# Patient Record
Sex: Female | Born: 1970 | Race: Black or African American | Hispanic: No | Marital: Single | Smoking: Current every day smoker
Health system: Southern US, Community
[De-identification: ages and names within clinical notes are randomized; demographics above are authoritative.]

## PROBLEM LIST (undated history)

## (undated) DIAGNOSIS — K625 Hemorrhage of anus and rectum: Secondary | ICD-10-CM

## (undated) DIAGNOSIS — K219 Gastro-esophageal reflux disease without esophagitis: Secondary | ICD-10-CM

## (undated) DIAGNOSIS — R12 Heartburn: Secondary | ICD-10-CM

## (undated) DIAGNOSIS — R109 Unspecified abdominal pain: Secondary | ICD-10-CM

## (undated) DIAGNOSIS — D219 Benign neoplasm of connective and other soft tissue, unspecified: Secondary | ICD-10-CM

## (undated) DIAGNOSIS — R21 Rash and other nonspecific skin eruption: Secondary | ICD-10-CM

## (undated) DIAGNOSIS — H539 Unspecified visual disturbance: Secondary | ICD-10-CM

## (undated) DIAGNOSIS — K5900 Constipation, unspecified: Secondary | ICD-10-CM

## (undated) DIAGNOSIS — D126 Benign neoplasm of colon, unspecified: Secondary | ICD-10-CM

## (undated) DIAGNOSIS — M791 Myalgia, unspecified site: Secondary | ICD-10-CM

## (undated) DIAGNOSIS — IMO0002 Reserved for concepts with insufficient information to code with codable children: Secondary | ICD-10-CM

## (undated) HISTORY — DX: Benign neoplasm of colon, unspecified: D12.6

## (undated) HISTORY — DX: Reserved for concepts with insufficient information to code with codable children: IMO0002

## (undated) HISTORY — DX: Unspecified visual disturbance: H53.9

## (undated) HISTORY — DX: Benign neoplasm of connective and other soft tissue, unspecified: D21.9

## (undated) HISTORY — DX: Myalgia, unspecified site: M79.10

## (undated) HISTORY — PX: WISDOM TOOTH EXTRACTION: SHX21

## (undated) HISTORY — DX: Heartburn: R12

## (undated) HISTORY — DX: Constipation, unspecified: K59.00

## (undated) HISTORY — DX: Hemorrhage of anus and rectum: K62.5

## (undated) HISTORY — DX: Rash and other nonspecific skin eruption: R21

## (undated) HISTORY — PX: THERAPEUTIC ABORTION: SHX798

## (undated) HISTORY — DX: Unspecified abdominal pain: R10.9

## (undated) HISTORY — DX: Gastro-esophageal reflux disease without esophagitis: K21.9

---

## 1998-12-18 ENCOUNTER — Emergency Department (HOSPITAL_COMMUNITY): Admission: EM | Admit: 1998-12-18 | Discharge: 1998-12-18 | Payer: Self-pay

## 1998-12-18 ENCOUNTER — Encounter: Payer: Self-pay | Admitting: Emergency Medicine

## 1998-12-29 ENCOUNTER — Emergency Department (HOSPITAL_COMMUNITY): Admission: EM | Admit: 1998-12-29 | Discharge: 1998-12-29 | Payer: Self-pay | Admitting: Emergency Medicine

## 1999-07-23 ENCOUNTER — Emergency Department (HOSPITAL_COMMUNITY): Admission: EM | Admit: 1999-07-23 | Discharge: 1999-07-24 | Payer: Self-pay | Admitting: Emergency Medicine

## 2000-09-02 ENCOUNTER — Emergency Department (HOSPITAL_COMMUNITY): Admission: EM | Admit: 2000-09-02 | Discharge: 2000-09-03 | Payer: Self-pay | Admitting: Emergency Medicine

## 2000-09-03 ENCOUNTER — Encounter: Payer: Self-pay | Admitting: Emergency Medicine

## 2001-09-03 ENCOUNTER — Encounter: Payer: Self-pay | Admitting: Emergency Medicine

## 2001-09-03 ENCOUNTER — Emergency Department (HOSPITAL_COMMUNITY): Admission: EM | Admit: 2001-09-03 | Discharge: 2001-09-03 | Payer: Self-pay | Admitting: Emergency Medicine

## 2002-07-13 ENCOUNTER — Emergency Department (HOSPITAL_COMMUNITY): Admission: EM | Admit: 2002-07-13 | Discharge: 2002-07-13 | Payer: Self-pay | Admitting: *Deleted

## 2002-10-08 ENCOUNTER — Emergency Department (HOSPITAL_COMMUNITY): Admission: EM | Admit: 2002-10-08 | Discharge: 2002-10-08 | Payer: Self-pay | Admitting: Emergency Medicine

## 2002-12-17 ENCOUNTER — Emergency Department (HOSPITAL_COMMUNITY): Admission: EM | Admit: 2002-12-17 | Discharge: 2002-12-18 | Payer: Self-pay | Admitting: Emergency Medicine

## 2002-12-20 ENCOUNTER — Inpatient Hospital Stay (HOSPITAL_COMMUNITY): Admission: AD | Admit: 2002-12-20 | Discharge: 2002-12-20 | Payer: Self-pay | Admitting: Obstetrics & Gynecology

## 2003-01-13 ENCOUNTER — Encounter: Admission: RE | Admit: 2003-01-13 | Discharge: 2003-01-13 | Payer: Self-pay | Admitting: Obstetrics and Gynecology

## 2005-08-07 ENCOUNTER — Other Ambulatory Visit: Admission: RE | Admit: 2005-08-07 | Discharge: 2005-08-07 | Payer: Self-pay | Admitting: Gynecology

## 2006-06-10 ENCOUNTER — Emergency Department (HOSPITAL_COMMUNITY): Admission: EM | Admit: 2006-06-10 | Discharge: 2006-06-10 | Payer: Self-pay | Admitting: Emergency Medicine

## 2006-08-17 ENCOUNTER — Emergency Department (HOSPITAL_COMMUNITY): Admission: EM | Admit: 2006-08-17 | Discharge: 2006-08-17 | Payer: Self-pay | Admitting: Emergency Medicine

## 2006-08-22 ENCOUNTER — Other Ambulatory Visit: Admission: RE | Admit: 2006-08-22 | Discharge: 2006-08-22 | Payer: Self-pay | Admitting: Gynecology

## 2006-11-14 ENCOUNTER — Encounter: Admission: RE | Admit: 2006-11-14 | Discharge: 2006-11-14 | Payer: Self-pay | Admitting: Occupational Medicine

## 2007-11-21 ENCOUNTER — Emergency Department (HOSPITAL_COMMUNITY): Admission: EM | Admit: 2007-11-21 | Discharge: 2007-11-21 | Payer: Self-pay | Admitting: Emergency Medicine

## 2008-03-07 ENCOUNTER — Emergency Department (HOSPITAL_COMMUNITY): Admission: EM | Admit: 2008-03-07 | Discharge: 2008-03-07 | Payer: Self-pay | Admitting: Family Medicine

## 2008-09-09 ENCOUNTER — Emergency Department (HOSPITAL_COMMUNITY): Admission: EM | Admit: 2008-09-09 | Discharge: 2008-09-09 | Payer: Self-pay | Admitting: Emergency Medicine

## 2008-09-27 ENCOUNTER — Emergency Department (HOSPITAL_COMMUNITY): Admission: EM | Admit: 2008-09-27 | Discharge: 2008-09-27 | Payer: Self-pay | Admitting: Emergency Medicine

## 2009-09-27 ENCOUNTER — Emergency Department (HOSPITAL_COMMUNITY): Admission: EM | Admit: 2009-09-27 | Discharge: 2009-09-27 | Payer: Self-pay | Admitting: Emergency Medicine

## 2009-10-24 ENCOUNTER — Ambulatory Visit (HOSPITAL_COMMUNITY): Admission: RE | Admit: 2009-10-24 | Discharge: 2009-10-24 | Payer: Self-pay | Admitting: Obstetrics & Gynecology

## 2010-01-04 ENCOUNTER — Ambulatory Visit: Payer: Self-pay | Admitting: Obstetrics and Gynecology

## 2010-01-05 ENCOUNTER — Ambulatory Visit (HOSPITAL_COMMUNITY)
Admission: RE | Admit: 2010-01-05 | Discharge: 2010-01-05 | Payer: Self-pay | Source: Home / Self Care | Attending: Family Medicine | Admitting: Family Medicine

## 2010-01-18 ENCOUNTER — Ambulatory Visit
Admission: RE | Admit: 2010-01-18 | Discharge: 2010-01-18 | Payer: Self-pay | Source: Home / Self Care | Attending: Obstetrics and Gynecology | Admitting: Obstetrics and Gynecology

## 2010-02-04 ENCOUNTER — Encounter: Payer: Self-pay | Admitting: Gynecology

## 2010-03-19 ENCOUNTER — Emergency Department (HOSPITAL_COMMUNITY)
Admission: EM | Admit: 2010-03-19 | Discharge: 2010-03-19 | Disposition: A | Discharge status: Home / Self Care | Payer: Self-pay | Attending: Emergency Medicine | Admitting: Emergency Medicine

## 2010-03-19 DIAGNOSIS — B9789 Other viral agents as the cause of diseases classified elsewhere: Secondary | ICD-10-CM | POA: Insufficient documentation

## 2010-03-19 DIAGNOSIS — IMO0001 Reserved for inherently not codable concepts without codable children: Secondary | ICD-10-CM | POA: Insufficient documentation

## 2010-03-19 LAB — CBC
HCT: 35 % — ABNORMAL LOW (ref 36.0–46.0)
RBC: 3.71 MIL/uL — ABNORMAL LOW (ref 3.87–5.11)
RDW: 12.8 % (ref 11.5–15.5)
WBC: 8.7 10*3/uL (ref 4.0–10.5)

## 2010-03-19 LAB — DIFFERENTIAL
Eosinophils Absolute: 0.3 10*3/uL (ref 0.0–0.7)
Lymphs Abs: 4 10*3/uL (ref 0.7–4.0)
Monocytes Relative: 7 % (ref 3–12)
Neutro Abs: 3.7 10*3/uL (ref 1.7–7.7)

## 2010-03-19 LAB — POCT I-STAT, CHEM 8
BUN: 12 mg/dL (ref 6–23)
Calcium, Ion: 1.12 mmol/L (ref 1.12–1.32)
Chloride: 105 mEq/L (ref 96–112)
Creatinine, Ser: 1 mg/dL (ref 0.4–1.2)
Potassium: 3.9 mEq/L (ref 3.5–5.1)
Sodium: 139 mEq/L (ref 135–145)
TCO2: 24 mmol/L (ref 0–100)

## 2010-04-12 ENCOUNTER — Inpatient Hospital Stay (INDEPENDENT_AMBULATORY_CARE_PROVIDER_SITE_OTHER)
Admission: RE | Admit: 2010-04-12 | Discharge: 2010-04-12 | Disposition: A | Discharge status: Home / Self Care | Payer: Self-pay | Source: Ambulatory Visit | Attending: Family Medicine | Admitting: Family Medicine

## 2010-04-12 DIAGNOSIS — M545 Low back pain, unspecified: Secondary | ICD-10-CM

## 2010-04-12 DIAGNOSIS — N898 Other specified noninflammatory disorders of vagina: Secondary | ICD-10-CM

## 2010-04-12 LAB — POCT URINALYSIS DIP (DEVICE)
Ketones, ur: NEGATIVE mg/dL
Nitrite: NEGATIVE
Protein, ur: NEGATIVE mg/dL
Specific Gravity, Urine: 1.02 (ref 1.005–1.030)
Urobilinogen, UA: 1 mg/dL (ref 0.0–1.0)

## 2010-04-12 LAB — POCT I-STAT, CHEM 8
BUN: 12 mg/dL (ref 6–23)
Calcium, Ion: 1.16 mmol/L (ref 1.12–1.32)
Creatinine, Ser: 0.7 mg/dL (ref 0.4–1.2)
Glucose, Bld: 79 mg/dL (ref 70–99)
HCT: 39 % (ref 36.0–46.0)
Hemoglobin: 13.3 g/dL (ref 12.0–15.0)
Potassium: 4 mEq/L (ref 3.5–5.1)
TCO2: 27 mmol/L (ref 0–100)

## 2010-06-01 NOTE — Group Therapy Note (Signed)
Alyssa Butler, Alyssa Butler                         ACCOUNT NO.:  [id number]   MEDICAL RECORD NO.:  [id number]                   PATIENT TYPE:  OUT   LOCATION:  [facility]                           FACILITY:  [facility]   PHYSICIAN:  Argentina Donovan, MD                     DATE OF BIRTH:  05/24/1970   DATE OF SERVICE:  01/13/2003                                    CLINIC NOTE   REASON FOR VISIT:  The patient is a 40 year old African-American gravida 1  para 0-0-1-0 who had a voluntary interruption of pregnancy at age 27 when  she was four months pregnant, uncomplicated, and has never gotten pregnant  since that time.  Basically, her reason for coming in was that she had  normal periods up until November and then in December had a 10-day episode  of dysfunctional bleeding that was not preceded by molimina that she  normally has.  Blood work showed that she had a normal hemoglobin and  hematocrit and no sign of infection and today on examination the external  genitalia was normal, BUS within normal limits, vagina clean and well  rugated.  The cervix was nulliparous and clean; the uterus anterior; normal  size, shape, consistency; free cul-de-sac; and normal adnexa.   IMPRESSION:  Normal gynecological exam.  Pap smear was taken.  We discussed  the infertility problem with the patient and briefly covered what would have  to be done in an infertility workup.  We have also discussed the possibility  that this is probably a temporary dysfunctional uterine bleeding episode and  she may go back to a normal cycle.  If not, we would just cycle her on oral  contraceptives for several months and hopefully she would revert back to a  normal cycle when stopping.  The patient plans on calling if there are any  further episodes.                                               Argentina Donovan, MD    PR/MEDQ  D:  01/13/2003  T:  01/13/2003  Job:  [id number]

## 2010-08-23 ENCOUNTER — Inpatient Hospital Stay (HOSPITAL_COMMUNITY)
Admission: AD | Admit: 2010-08-23 | Discharge: 2010-08-23 | Disposition: A | Discharge status: Home / Self Care | Payer: Self-pay | Source: Ambulatory Visit | Attending: Obstetrics & Gynecology | Admitting: Obstetrics & Gynecology

## 2010-08-23 ENCOUNTER — Encounter (HOSPITAL_COMMUNITY): Payer: Self-pay

## 2010-08-23 DIAGNOSIS — N912 Amenorrhea, unspecified: Secondary | ICD-10-CM | POA: Insufficient documentation

## 2010-08-23 DIAGNOSIS — R109 Unspecified abdominal pain: Secondary | ICD-10-CM | POA: Insufficient documentation

## 2010-08-23 DIAGNOSIS — R5383 Other fatigue: Secondary | ICD-10-CM

## 2010-08-23 DIAGNOSIS — R5381 Other malaise: Secondary | ICD-10-CM | POA: Insufficient documentation

## 2010-08-23 LAB — DIFFERENTIAL
Basophils Relative: 1 % (ref 0–1)
Lymphocytes Relative: 36 % (ref 12–46)
Monocytes Absolute: 0.6 10*3/uL (ref 0.1–1.0)
Monocytes Relative: 7 % (ref 3–12)
Neutro Abs: 4.7 10*3/uL (ref 1.7–7.7)
Neutrophils Relative %: 53 % (ref 43–77)

## 2010-08-23 LAB — CBC
HCT: 35.5 % — ABNORMAL LOW (ref 36.0–46.0)
MCH: 31 pg (ref 26.0–34.0)
MCHC: 33.8 g/dL (ref 30.0–36.0)
Platelets: 309 10*3/uL (ref 150–400)
RDW: 13.1 % (ref 11.5–15.5)

## 2010-08-23 LAB — WET PREP, GENITAL: Trich, Wet Prep: NONE SEEN

## 2010-08-23 LAB — URINALYSIS, ROUTINE W REFLEX MICROSCOPIC: Leukocytes, UA: NEGATIVE

## 2010-08-23 LAB — POCT PREGNANCY, URINE: Preg Test, Ur: NEGATIVE

## 2010-08-23 NOTE — Progress Notes (Signed)
Pt states no menses since mid-June, having some cramping, dull, unsure if has vaginal d/c, uses powder. Has vaginal itching.

## 2010-08-23 NOTE — ED Provider Notes (Addendum)
History     CSN: [id number] Arrival date & time: 08/23/2010 10:30 AM  Chief Complaint  Patient presents with  . Fatigue   HPI Alyssa Butler is a 40 y.o. AA female who presents to [facility] with low abdominal cramping and amenorrhea. LMP  06/29/10. She is feeling tired for past 3 weeks. She describes the abdominal cramping as feeling like she is going to start her period but doesn't.  No regular sex partner. Last sexual intercourse end of June, unprotected but states had not had sex for a year before then. History of trichomonas years ago.    Past Medical History  Diagnosis Date  . No pertinent past medical history     Past Surgical History  Procedure Date  . Dilation and curettage of uterus   . Wisdom tooth extraction     No family history on file.  History  Substance Use Topics  . Smoking status: Current Everyday Smoker -- 0.2 packs/day for 15 years    Types: Cigars  . Smokeless tobacco: Never Used  . Alcohol Use: Yes    OB History    Grav Para Term Preterm Abortions TAB SAB Ect Mult Living   1    1 1           Review of Systems  Constitutional: Positive for fatigue. Negative for fever, chills and diaphoresis.  HENT: Negative for ear pain, congestion, sore throat, facial swelling, neck pain, neck stiffness, dental problem and sinus pressure.   Eyes: Negative for photophobia, pain and discharge.  Respiratory: Negative for cough, chest tightness and wheezing.   Cardiovascular: Negative.   Gastrointestinal: Positive for abdominal pain. Negative for nausea, vomiting, diarrhea, constipation and abdominal distention.  Genitourinary: Negative for dysuria, frequency, flank pain and difficulty urinating.  Musculoskeletal: Negative for myalgias, back pain and gait problem.  Skin: Negative for color change and rash.  Neurological: Negative for dizziness, speech difficulty, weakness, light-headedness, numbness and headaches.  Psychiatric/Behavioral: Negative for confusion and  agitation.    Physical Exam  BP 132/83  Pulse 83  Temp(Src) 98.2 F (36.8 C) (Oral)  Resp 16  Ht 5' 5.5" (1.664 m)  Wt 200 lb 3.2 oz (90.81 kg)  BMI 32.81 kg/m2  SpO2 99%  LMP 06/28/2010  Physical Exam  Nursing note and vitals reviewed. Constitutional: She is oriented to person, place, and time. She appears well-developed and well-nourished.  HENT:  Head: Normocephalic.  Eyes: EOM are normal.  Neck: Neck supple.  Pulmonary/Chest: Effort normal.  Abdominal: Soft. There is no tenderness.  Genitourinary:       There is a small amount of white vaginal discharge. No bleeding noted. No cervical motion tenderness, no adnexal mass palpated, no tenderness on exam. The uterus is not enlarged and is not tender.   Musculoskeletal: Normal range of motion.  Neurological: She is alert and oriented to person, place, and time. No cranial nerve deficit.  Skin: Skin is warm and dry.    ED Course  Procedures  MDM  Assessment: Amenorrhea                       Fatigue  Plan:     Follow up with GYN Clinic              Return here as needed.  Results for orders placed during the hospital encounter of 08/23/10 (from the past 24 hour(s))  URINALYSIS, ROUTINE W REFLEX MICROSCOPIC     Status: Abnormal   Collection Time  08/23/10 11:00 AM      Component Value Range   Color, Urine YELLOW  YELLOW    Appearance CLEAR  CLEAR    Specific Gravity, Urine 1.020  1.005 - 1.030    pH 6.5  5.0 - 8.0    Glucose, UA NEGATIVE  NEGATIVE (mg/dL)   Hgb urine dipstick SMALL (*) NEGATIVE    Bilirubin Urine NEGATIVE  NEGATIVE    Ketones, ur NEGATIVE  NEGATIVE (mg/dL)   Protein, ur NEGATIVE  NEGATIVE (mg/dL)   Urobilinogen, UA 0.2  0.0 - 1.0 (mg/dL)   Nitrite NEGATIVE  NEGATIVE    Leukocytes, UA NEGATIVE  NEGATIVE   URINE MICROSCOPIC-ADD ON     Status: Abnormal   Collection Time   08/23/10 11:00 AM      Component Value Range   Squamous Epithelial / LPF FEW (*) RARE    RBC / HPF 0-2  <3 (RBC/hpf)  POCT  PREGNANCY, URINE     Status: Normal   Collection Time   08/23/10 11:03 AM      Component Value Range   Preg Test, Ur NEGATIVE    WET PREP, GENITAL     Status: Abnormal   Collection Time   08/23/10 11:38 AM      Component Value Range   Yeast, Wet Prep NONE SEEN  NONE SEEN    Trich, Wet Prep NONE SEEN  NONE SEEN    Clue Cells, Wet Prep FEW (*) NONE SEEN    WBC, Wet Prep HPF POC FEW (*) NONE SEEN   CBC     Status: Abnormal   Collection Time   08/23/10 11:53 AM      Component Value Range   WBC 8.8  4.0 - 10.5 (K/uL)   RBC 3.87  3.87 - 5.11 (MIL/uL)   Hemoglobin 12.0  12.0 - 15.0 (g/dL)   HCT 16.1 (*) 09.6 - 46.0 (%)   MCV 91.7  78.0 - 100.0 (fL)   MCH 31.0  26.0 - 34.0 (pg)   MCHC 33.8  30.0 - 36.0 (g/dL)   RDW 04.5  40.9 - 81.1 (%)   Platelets 309  150 - 400 (K/uL)  DIFFERENTIAL     Status: Normal   Collection Time   08/23/10 11:53 AM      Component Value Range   Neutrophils Relative 53  43 - 77 (%)   Neutro Abs 4.7  1.7 - 7.7 (K/uL)   Lymphocytes Relative 36  12 - 46 (%)   Lymphs Abs 3.1  0.7 - 4.0 (K/uL)   Monocytes Relative 7  3 - 12 (%)   Monocytes Absolute 0.6  0.1 - 1.0 (K/uL)   Eosinophils Relative 3  0 - 5 (%)   Eosinophils Absolute 0.3  0.0 - 0.7 (K/uL)   Basophils Relative 1  0 - 1 (%)   Basophils Absolute 0.1  0.0 - 0.1 (K/uL)        Kerrie Buffalo, NP 08/23/10 1219

## 2010-08-23 NOTE — Progress Notes (Signed)
Pt states she has not had a period since 6-14 and feels fatigued. Has a little cramping.

## 2010-09-27 ENCOUNTER — Encounter: Payer: Self-pay | Admitting: Obstetrics and Gynecology

## 2010-09-27 ENCOUNTER — Ambulatory Visit (INDEPENDENT_AMBULATORY_CARE_PROVIDER_SITE_OTHER): Payer: Self-pay | Admitting: Obstetrics and Gynecology

## 2010-09-27 VITALS — BP 131/83 | HR 74 | Temp 98.6°F | Ht 66.0 in | Wt 198.9 lb

## 2010-09-27 DIAGNOSIS — Z124 Encounter for screening for malignant neoplasm of cervix: Secondary | ICD-10-CM

## 2010-09-27 DIAGNOSIS — L293 Anogenital pruritus, unspecified: Secondary | ICD-10-CM

## 2010-09-27 DIAGNOSIS — L989 Disorder of the skin and subcutaneous tissue, unspecified: Secondary | ICD-10-CM

## 2010-09-27 NOTE — Progress Notes (Signed)
The patient is a 40 year old African American female nulligravida. She came in because she noticed a painful lump between the lower portion of the fourchette of the vagina and the anus. The pain has disappeared but the lump was persistent. She also has not had a Pap smear in a year.  Examination genital external normal with a slight elevation of a palpable half of centimeter mass midway between the fourchette of the vagina and the anus. Introitus marital BUS within normal limits. Vagina clean and well rugated cervix clean nulliparous Pap smear was taken. Uterus bimanual normal size shape consistency. Adnexa not palpable.  Impression: Probable resolving boil.

## 2010-09-27 NOTE — Progress Notes (Signed)
Pt has a bump in perineal area, was painful at first but no longer is, would like to have it checked.

## 2010-10-10 ENCOUNTER — Inpatient Hospital Stay (INDEPENDENT_AMBULATORY_CARE_PROVIDER_SITE_OTHER)
Admission: RE | Admit: 2010-10-10 | Discharge: 2010-10-10 | Disposition: A | Discharge status: Home / Self Care | Payer: Self-pay | Source: Ambulatory Visit | Attending: Family Medicine | Admitting: Family Medicine

## 2010-10-10 DIAGNOSIS — M62838 Other muscle spasm: Secondary | ICD-10-CM

## 2010-10-17 LAB — URINALYSIS, ROUTINE W REFLEX MICROSCOPIC
Glucose, UA: NEGATIVE
Ketones, ur: NEGATIVE
Specific Gravity, Urine: 1.019
Urobilinogen, UA: 1
pH: 7

## 2010-10-17 LAB — DIFFERENTIAL
Basophils Relative: 1
Eosinophils Absolute: 0.2
Lymphs Abs: 3
Monocytes Absolute: 0.4
Neutro Abs: 3.9

## 2010-10-17 LAB — COMPREHENSIVE METABOLIC PANEL
ALT: 13
AST: 16
Albumin: 3.8
Alkaline Phosphatase: 68
Potassium: 3.6
Sodium: 140
Total Protein: 6.3

## 2010-10-17 LAB — GC/CHLAMYDIA PROBE AMP, GENITAL
Chlamydia, DNA Probe: NEGATIVE
GC Probe Amp, Genital: NEGATIVE

## 2010-10-17 LAB — WET PREP, GENITAL
Clue Cells Wet Prep HPF POC: NONE SEEN
Trich, Wet Prep: NONE SEEN

## 2010-10-17 LAB — CBC
Platelets: 331
RDW: 13.1
WBC: 7.6

## 2010-10-17 LAB — URINE MICROSCOPIC-ADD ON

## 2010-10-19 ENCOUNTER — Encounter (HOSPITAL_COMMUNITY): Payer: Self-pay

## 2010-10-19 ENCOUNTER — Other Ambulatory Visit: Payer: Self-pay | Admitting: Obstetrics and Gynecology

## 2010-10-19 ENCOUNTER — Inpatient Hospital Stay (HOSPITAL_COMMUNITY)
Admission: AD | Admit: 2010-10-19 | Discharge: 2010-10-19 | Disposition: A | Discharge status: Home / Self Care | Payer: Self-pay | Source: Ambulatory Visit | Attending: Obstetrics & Gynecology | Admitting: Obstetrics & Gynecology

## 2010-10-19 DIAGNOSIS — Z1231 Encounter for screening mammogram for malignant neoplasm of breast: Secondary | ICD-10-CM

## 2010-10-19 DIAGNOSIS — K625 Hemorrhage of anus and rectum: Secondary | ICD-10-CM | POA: Insufficient documentation

## 2010-10-19 LAB — CBC
HCT: 37.8 % (ref 36.0–46.0)
Hemoglobin: 12.6 g/dL (ref 12.0–15.0)
MCH: 30.9 pg (ref 26.0–34.0)
MCHC: 33.3 g/dL (ref 30.0–36.0)
MCV: 92.6 fL (ref 78.0–100.0)

## 2010-10-19 LAB — OCCULT BLOOD X 1 CARD TO LAB, STOOL: Fecal Occult Bld: NEGATIVE

## 2010-10-19 MED ORDER — HYDROCORTISONE 2.5 % RE CREA: TOPICAL_CREAM | Freq: Two times a day (BID) | RECTAL | Status: AC

## 2010-10-19 NOTE — Progress Notes (Signed)
Pt states, " I have had rectal bleeding every time I have a BM since Tuesday.I covers the water and is bright red."

## 2010-10-19 NOTE — ED Provider Notes (Signed)
Alyssa Butler y.o.G1P0010 LMP Sept Chief Complaint  Patient presents with  . Rectal Bleeding    SUBJECTIVE  HPI: the patient reports a small amount of rectal bleeding intermittently for several years, but is especially concerned now due to having what she feels is a large amount of bright red blood passed in the toilet with her bowel movements for the past week. Today she went to defecate and passed only blood turning the water red in the toilet. He states that in her teens she had a workup with [facility] for rectal bleeding which sounds like it included a colonoscopy. She does not know what the diagnosis was. She denies constipation seeing her regular pattern is to have a bowel movement about every 3 days. Stools are not hard and she does not strain. Last week she took an over-the-counter product 2 having a bowel movement for "colon cleansing."She denies bleeding from any other orifices. She has no orthostatic symptoms. Denies rectal intercourse. Denies condylomata for any gynecologic complaints.  Past Medical History  Diagnosis Date  . No pertinent past medical history    Ob Hx: Gyn Hx: Past Surgical History  Procedure Date  . Dilation and curettage of uterus   . Wisdom tooth extraction    History   Social History  . Marital Status: Single    Spouse Name: N/A    Number of Children: N/A  . Years of Education: N/A   Occupational History  . Not on file.   Social History Main Topics  . Smoking status: Current Everyday Smoker -- 0.2 packs/day for 15 years    Types: Cigars  . Smokeless tobacco: Never Used  . Alcohol Use: Yes  . Drug Use: No  . Sexually Active: Yes    Birth Control/ Protection: None   Other Topics Concern  . Not on file   Social History Narrative  . No narrative on file   No current facility-administered medications on file prior to encounter.   Current Outpatient Prescriptions on File Prior to Encounter  Medication Sig Dispense Refill  . ibuprofen  (ADVIL,MOTRIN) 200 MG tablet Take 400 mg by mouth every 6 (six) hours as needed. As needed for pain        No Known Allergies  Review of Systems  Constitutional: Negative for fever, chills, weight loss and diaphoresis.  Gastrointestinal: Positive for blood in stool and melena. Negative for nausea, vomiting, abdominal pain, diarrhea and constipation.  Neurological: Negative for weakness and headaches.     OBJECTIVE  BP 132/79  Pulse 85  Temp(Src) 98.8 F (37.1 C) (Oral)  Resp 18  Ht 5\' 6"  (1.676 m)  Wt 89.415 kg (197 lb 2 oz)  BMI 31.82 kg/m2  LMP 09/21/2010   BP 132/79  Pulse 85  Temp(Src) 98.8 F (37.1 C) (Oral)  Resp 18  Ht 5\' 6"  (1.676 m)  Wt 89.415 kg (197 lb 2 oz)  BMI 31.82 kg/m2  LMP 09/21/2010   Gen. Obese female in NAD Abdomen: Soft protuberant, nondistended, nontender, no organomegaly or masses Rectal: No external hemorrhoids or lesions. No internal hemorrhoids, polyp, fissure noted. Just inside the anal verge there is roughened area which feels condylomatous. No gross blood noted.    Results for orders placed during the hospital encounter of 10/19/10 (from the past 24 hour(s))  POCT PREGNANCY, URINE     Status: Normal   Collection Time   10/19/10  5:33 PM      Component Value Range   Preg Test,  Ur NEGATIVE    OCCULT BLOOD X 1 CARD TO LAB, STOOL     Status: Normal   Collection Time   10/19/10  7:03 PM      Component Value Range   Fecal Occult Bld NEGATIVE    CBC     Status: Normal   Collection Time   10/19/10  7:17 PM      Component Value Range   WBC 8.4  4.0 - 10.5 (K/uL)   RBC 4.08  3.87 - 5.11 (MIL/uL)   Hemoglobin 12.6  12.0 - 15.0 (g/dL)   HCT 16.1  09.6 - 04.5 (%)   MCV 92.6  78.0 - 100.0 (fL)   MCH 30.9  26.0 - 34.0 (pg)   MCHC 33.3  30.0 - 36.0 (g/dL)   RDW 40.9  81.1 - 91.4 (%)   Platelets 340  150 - 400 (K/uL)                                                              ASSESSMENT  Rectal bleeding. Hemodynamically  stable.    PLAN Try Anusol HC in case there is an inflamed crypt, fissure or int hemorrhoid. C/W Dr. Ewing Schlein [facility]) who is on unassigned call for [facility] not here and he advises she should call [facility] for f/u.

## 2010-10-19 NOTE — Progress Notes (Signed)
Pt states has noted blood in stool, denies constipation, has noted blood x1 week, no recent intercourse, no rectal intercourse at all. Denies pain with bowel mvmts.

## 2010-11-01 ENCOUNTER — Ambulatory Visit (HOSPITAL_COMMUNITY)
Admission: RE | Admit: 2010-11-01 | Discharge: 2010-11-01 | Disposition: A | Discharge status: Home / Self Care | Payer: Self-pay | Source: Ambulatory Visit | Attending: Obstetrics and Gynecology | Admitting: Obstetrics and Gynecology

## 2010-11-01 DIAGNOSIS — Z1231 Encounter for screening mammogram for malignant neoplasm of breast: Secondary | ICD-10-CM

## 2011-04-02 ENCOUNTER — Encounter (HOSPITAL_COMMUNITY): Payer: Self-pay

## 2011-04-02 ENCOUNTER — Emergency Department (HOSPITAL_COMMUNITY)
Admission: EM | Admit: 2011-04-02 | Discharge: 2011-04-02 | Disposition: A | Discharge status: Home / Self Care | Payer: Self-pay | Attending: Emergency Medicine | Admitting: Emergency Medicine

## 2011-04-02 DIAGNOSIS — M549 Dorsalgia, unspecified: Secondary | ICD-10-CM | POA: Insufficient documentation

## 2011-04-02 DIAGNOSIS — F17200 Nicotine dependence, unspecified, uncomplicated: Secondary | ICD-10-CM | POA: Insufficient documentation

## 2011-04-02 LAB — URINALYSIS, ROUTINE W REFLEX MICROSCOPIC
Glucose, UA: NEGATIVE mg/dL
Protein, ur: NEGATIVE mg/dL

## 2011-04-02 LAB — URINE MICROSCOPIC-ADD ON

## 2011-04-02 LAB — POCT PREGNANCY, URINE: Preg Test, Ur: NEGATIVE

## 2011-04-02 MED ORDER — HYDROCODONE-ACETAMINOPHEN 5-325 MG PO TABS: 1.0000 | ORAL_TABLET | ORAL | Status: AC | PRN

## 2011-04-02 MED ORDER — DIAZEPAM 5 MG PO TABS: 5.0000 mg | ORAL_TABLET | Freq: Two times a day (BID) | ORAL | Status: AC

## 2011-04-02 NOTE — Discharge Instructions (Signed)
Followup with orthopedics if symptoms continue. Use conservative methods at home including heat therapy and cold therapy as we discussed. More information on cold therapy is listed below.  It is not reccommended to use heat treatment directly after an acute injury.  Take pain medication and muscle relaxer as prescribed.  Do not drive or operate heavy machinery while taking these medications or for four hours after taking medication  SEEK IMMEDIATE MEDICAL ATTENTION IF: New numbness, tingling, weakness, or problem with the use of your arms or legs.  Severe back pain not relieved with medications.  Change in bowel or bladder control.  Increasing pain in any areas of the body (such as chest or abdominal pain).  Shortness of breath, dizziness or fainting.  Nausea (feeling sick to your stomach), vomiting, fever, or sweats.  COLD THERAPY DIRECTIONS:  Ice or gel packs can be used to reduce both pain and swelling. Ice is the most helpful within the first 24 to 48 hours after an injury or flareup from overusing a muscle or joint.  Ice is effective, has very few side effects, and is safe for most people to use.   If you expose your skin to cold temperatures for too long or without the proper protection, you can damage your skin or nerves. Watch for signs of skin damage due to cold.   HOME CARE INSTRUCTIONS  Follow these tips to use ice and cold packs safely.  Place a dry or damp towel between the ice and skin. A damp towel will cool the skin more quickly, so you may need to shorten the time that the ice is used.  For a more rapid response, add gentle compression to the ice.  Ice for no more than 10 to 20 minutes at a time. The bonier the area you are icing, the less time it will take to get the benefits of ice.  Check your skin after 5 minutes to make sure there are no signs of a poor response to cold or skin damage.  Rest 20 minutes or more in between uses.  Once your skin is numb, you can end your  treatment. You can test numbness by very lightly touching your skin. The touch should be so light that you do not see the skin dimple from the pressure of your fingertip. When using ice, most people will feel these normal sensations in this order: cold, burning, aching, and numbness.  Do not use ice on someone who cannot communicate their responses to pain, such as small children or people with dementia.   HOW TO MAKE AN ICE PACK  To make an ice pack, do one of the following:  Place crushed ice or a bag of frozen vegetables in a sealable plastic bag. Squeeze out the excess air. Place this bag inside another plastic bag. Slide the bag into a pillowcase or place a damp towel between your skin and the bag.  Mix 3 parts water with 1 part rubbing alcohol. Freeze the mixture in a sealable plastic bag. When you remove the mixture from the freezer, it will be slushy. Squeeze out the excess air. Place this bag inside another plastic bag. Slide the bag into a pillowcase or place a damp towel between your skin and the bag.   SEEK MEDICAL CARE IF:  You develop white spots on your skin. This may give the skin a blotchy (mottled) appearance.  Your skin turns blue or pale.  Your skin becomes waxy or hard.  Your swelling  gets worse.  MAKE SURE YOU:  Understand these instructions.  Will watch your condition.  Will get help right away if you are not doing well or get worse.    Chronic Pain Discharge Instructions  Emergency care providers appreciate that many patients coming to [place] are in severe pain and we wish to address their pain in the safest, most responsible manner.  It is important to recognize however, that the proper treatment of chronic pain differs from that of the pain of injuries and acute illnesses.  Our goal is to provide quality, safe, personalized care and we thank you for giving [place] the opportunity to serve you. The use of narcotics and related agents for chronic pain syndromes may lead to  additional physical and psychological problems.  Nearly as many people die from prescription narcotics each year as die from car crashes.  Additionally, this risk is increased if such prescriptions are obtained from a variety of sources.  Therefore, only your primary care physician or a pain management specialist is able to safely treat such syndromes with narcotic medications long-term.    Documentation revealing such prescriptions have been sought from multiple sources may prohibit [place] from providing a refill or different narcotic medication.  Your name may be checked first through the [facility] Controlled Substances Reporting System.  This database is a record of controlled substance medication prescriptions that the patient has received.  This has been established by [facility] in an effort to eliminate the dangerous, and often life threatening, practice of obtaining multiple prescriptions from different medical providers.   If you have a chronic pain syndrome (i.e. chronic headaches, recurrent back or neck pain, dental pain, abdominal or pelvis pain without a specific diagnosis, or neuropathic pain such as fibromyalgia) or recurrent visits for the same condition without an acute diagnosis, you may be treated with non-narcotics and other non-addictive medicines.  Allergic reactions or negative side effects that may be reported by a patient to such medications will not typically lead to the use of a narcotic analgesic or other controlled substance as an alternative.   Patients managing chronic pain with a personal physician should have provisions in place for breakthrough pain.  If you are in crisis, you should call your physician.  If your physician directs you to the emergency department, please have the doctor call and speak to our attending physician concerning your care.   When patients come to the Emergency Department (ED) with acute medical conditions in which the Emergency Department  physician feels appropriate to prescribe narcotic or sedating pain medication, the physician will prescribe these in very limited quantities.  The amount of these medications will last only until you can see your primary care physician in his/her office.  Any patient who returns to the ED seeking refills should expect only non-narcotic pain medications.   In the event of an acute medical condition exists and the emergency physician feels it is necessary that the patient be given a narcotic or sedating medication -  a responsible adult driver should be present in the room prior to the medication being given by the nurse.   Prescriptions for narcotic or sedating medications that have been lost, stolen or expired will not be refilled in the Emergency Department.    Patients who have chronic pain may receive non-narcotic prescriptions until seen by their primary care physician.  It is every patient's personal responsibility to maintain active prescriptions with his or her primary care physician or specialist.  RESOURCE GUIDE  Dental Problems  Patients with Medicaid: [facility] [phone] [address]                                           [phone] [address] Phone:  [phone]                                                  Phone:  [phone]  If unable to pay or uninsured, contact:  [facility] or [facility]. to become qualified for the adult dental clinic.  Chronic Pain Problems Contact [facility]  [phone] Patients need to be referred by their primary care doctor.  Insufficient Money for Medicine Contact [employer]:  call "211" or [facility] [phone].  No Primary Care Doctor Call [facility]  [phone] Other agencies that provide inexpensive medical care    [facility]  [phone]    [facility]  [phone]    [facility]  [phone]    [facility]  [phone]    [facility]  [phone]    [facility]  [phone]  Psychological Services [facility]  [phone] [facility]  [phone] [facility] Mental Health   [phone] (emergency services ([phone])  Substance Abuse Resources [facility]  ([phone] [facility] 2261265910 [facility] [place] [phone] Floydene Flock [phone] Residential & Outpatient Substance Abuse Program  (585)711-9995  Abuse/Neglect [facility] Child Abuse Hotline (615)506-8063 [facility] Child Abuse Hotline 812-480-7604 (After Hours)  Emergency Shelter [facility] Ministries (308)590-6729  Maternity Homes Room at the [place] [facility] [place] 813-694-5298 [facility] ([phone]  MRSA Hotline #:   343 717 8172    [facility] Resources  [facility] [place]     [employer]                          [facility] Dept. [address] [place]                       [address]      [address]  Blondell Reveal Phone:  [phone]                                   Phone:  [phone]                 Phone:  [phone]  [facility] Phone:  [phone]  [facility] Child Abuse Hotline [phone] (After Hours)

## 2011-04-02 NOTE — ED Notes (Signed)
Pt c/o lower back pain radiating to pelvic and lt arm pain x2wks, denies vaginal d/c or odor, denies n/v/d or difficulty urinating, last nbm 04/01/11, states had the noro virsus over the weekend but better now.

## 2011-04-02 NOTE — ED Provider Notes (Signed)
History     CSN: [id number]  Arrival date & time 04/02/11  1035   First MD Initiated Contact with Patient 04/02/11 1117      Chief Complaint  Patient presents with  . Back Pain    (Consider location/radiation/quality/duration/timing/severity/associated sxs/prior treatment) HPI Comments: Patient comes in today with a chief complaint of lower back pain for the past 2 weeks.  She denies any acute injury.  She denies any fever, numbness, loss of bowel or bladder function, or urinary retention.  She denies any prior history of IV drug use or cancer.  She reports that pain is worse when she twist her back and when she bends her lower back.  Patient is a 41 y.o. female presenting with back pain. The history is provided by the patient.  Back Pain  This is a new problem. Episode onset: two weeks ago. The problem has been gradually worsening. The pain is associated with no known injury. The pain is present in the lumbar spine. The pain radiates to the left thigh. The symptoms are aggravated by bending and twisting. Pertinent negatives include no fever, no numbness, no abdominal pain, no bowel incontinence, no perianal numbness, no bladder incontinence, no dysuria, no paresthesias, no paresis, no tingling and no weakness. She has tried nothing for the symptoms.    Past Medical History  Diagnosis Date  . No pertinent past medical history     Past Surgical History  Procedure Date  . Dilation and curettage of uterus   . Wisdom tooth extraction     No family history on file.  History  Substance Use Topics  . Smoking status: Current Everyday Smoker -- 0.2 packs/day for 15 years    Types: Cigars  . Smokeless tobacco: Never Used  . Alcohol Use: Yes    OB History    Grav Para Term Preterm Abortions TAB SAB Ect Mult Living   1    1 1           Review of Systems  Constitutional: Negative for fever and chills.  HENT: Negative for neck pain and neck stiffness.   Gastrointestinal: Negative  for nausea, vomiting, abdominal pain and bowel incontinence.  Genitourinary: Negative for bladder incontinence, dysuria, frequency, hematuria, flank pain and decreased urine volume.  Musculoskeletal: Positive for back pain. Negative for gait problem.  Skin: Negative for color change and rash.  Neurological: Negative for dizziness, tingling, weakness, light-headedness, numbness and paresthesias.    Allergies  Review of patient's allergies indicates no known allergies.  Home Medications  No current outpatient prescriptions on file.  BP 131/74  Pulse 84  Temp(Src) 98.3 F (36.8 C) (Oral)  Resp 20  SpO2 100%  LMP 02/27/2011  Physical Exam  Nursing note and vitals reviewed. Constitutional: She is oriented to person, place, and time. She appears well-developed and well-nourished. No distress.  HENT:  Head: Normocephalic and atraumatic.  Eyes: Conjunctivae and EOM are normal. No scleral icterus.  Neck: Normal range of motion and full passive range of motion without pain. Neck supple. No spinous process tenderness and no muscular tenderness present. No rigidity. Normal range of motion present.  Cardiovascular: Normal rate, regular rhythm and intact distal pulses.  Exam reveals no gallop and no friction rub.   No murmur heard. Pulmonary/Chest: Effort normal and breath sounds normal. No respiratory distress. She has no wheezes. She has no rales. She exhibits no tenderness.  Musculoskeletal:       Cervical back: She exhibits normal range of motion, no  tenderness, no bony tenderness and no pain.       Thoracic back: She exhibits no tenderness, no bony tenderness and no pain.       Lumbar back: She exhibits pain and spasm. She exhibits no tenderness, no bony tenderness, no deformity and normal pulse.       Right foot: She exhibits no swelling.       Left foot: She exhibits no swelling.       Bilateral lower extremities nontender without color change, baseline range of motion of extremities  with intact distal pulses, capillary refill less than 2 seconds bilaterally.  Pt has increased pain w ROM of lumbar spine. Pain w ambulation, no sign of ataxia.  Neurological: She is alert and oriented to person, place, and time. She has normal strength and normal reflexes. No sensory deficit. Gait normal.       sensation at baseline for light touch in all 4 distal extremities, motor symmetric & bilateral 5/5  Abduction,adduction,flexion of hips, knee flexion & extension, foot dorsiflexion, foot plantar flexion.  Skin: Skin is warm and dry. No rash noted. She is not diaphoretic. No erythema. No pallor.  Psychiatric: She has a normal mood and affect.    ED Course  Procedures (including critical care time)  Labs Reviewed  URINALYSIS, ROUTINE W REFLEX MICROSCOPIC - Abnormal; Notable for the following:    Color, Urine AMBER (*) BIOCHEMICALS MAY BE AFFECTED BY COLOR   Hgb urine dipstick TRACE (*)    Ketones, ur TRACE (*)    All other components within normal limits  URINE MICROSCOPIC-ADD ON - Abnormal; Notable for the following:    Squamous Epithelial / LPF FEW (*)    Bacteria, UA FEW (*)    All other components within normal limits  POCT PREGNANCY, URINE   No results found.   No diagnosis found.    MDM  Patient with back pain.  No neurological deficits and normal neuro exam.  Patient can walk but states is painful.  No loss of bowel or bladder control.  No concern for cauda equina.  No fever, night sweats, weight loss, h/o cancer, IVDU.  RICE protocol and pain medicine indicated and discussed with patient.   Discussed the possibility of getting a lumbar spine xray with patient.  She states that she would rather try the medications first to see if they will help.  No tenderness to palpation of lumbar spine, therefore, do not feel that a xray is needed at this time.        Pascal Lux [place], PA-C 04/02/11 2119

## 2011-04-03 NOTE — ED Provider Notes (Signed)
Medical screening examination/treatment/procedure(s) were performed by non-physician practitioner and as supervising physician I was immediately available for consultation/collaboration.  Castulo Scarpelli L Marceil Welp, MD 04/03/11 1302 

## 2011-04-23 ENCOUNTER — Emergency Department (HOSPITAL_COMMUNITY)
Admission: EM | Admit: 2011-04-23 | Discharge: 2011-04-23 | Disposition: A | Discharge status: Home / Self Care | Payer: Self-pay | Attending: Emergency Medicine | Admitting: Emergency Medicine

## 2011-04-23 ENCOUNTER — Encounter (HOSPITAL_COMMUNITY): Payer: Self-pay

## 2011-04-23 DIAGNOSIS — R109 Unspecified abdominal pain: Secondary | ICD-10-CM | POA: Insufficient documentation

## 2011-04-23 DIAGNOSIS — K921 Melena: Secondary | ICD-10-CM | POA: Insufficient documentation

## 2011-04-23 DIAGNOSIS — F17200 Nicotine dependence, unspecified, uncomplicated: Secondary | ICD-10-CM | POA: Insufficient documentation

## 2011-04-23 DIAGNOSIS — K644 Residual hemorrhoidal skin tags: Secondary | ICD-10-CM | POA: Insufficient documentation

## 2011-04-23 DIAGNOSIS — K625 Hemorrhage of anus and rectum: Secondary | ICD-10-CM | POA: Insufficient documentation

## 2011-04-23 DIAGNOSIS — K648 Other hemorrhoids: Secondary | ICD-10-CM | POA: Insufficient documentation

## 2011-04-23 DIAGNOSIS — R10814 Left lower quadrant abdominal tenderness: Secondary | ICD-10-CM | POA: Insufficient documentation

## 2011-04-23 LAB — COMPREHENSIVE METABOLIC PANEL
AST: 14 U/L (ref 0–37)
Albumin: 4.1 g/dL (ref 3.5–5.2)
Alkaline Phosphatase: 87 U/L (ref 39–117)
Chloride: 104 mEq/L (ref 96–112)
Potassium: 3.6 mEq/L (ref 3.5–5.1)
Sodium: 138 mEq/L (ref 135–145)
Total Bilirubin: 0.6 mg/dL (ref 0.3–1.2)

## 2011-04-23 LAB — DIFFERENTIAL
Basophils Absolute: 0.1 10*3/uL (ref 0.0–0.1)
Basophils Relative: 1 % (ref 0–1)
Neutro Abs: 3.7 10*3/uL (ref 1.7–7.7)
Neutrophils Relative %: 43 % (ref 43–77)

## 2011-04-23 LAB — CBC
Hemoglobin: 12.2 g/dL (ref 12.0–15.0)
MCHC: 34.1 g/dL (ref 30.0–36.0)
RDW: 12.8 % (ref 11.5–15.5)

## 2011-04-23 LAB — URINALYSIS, ROUTINE W REFLEX MICROSCOPIC
Glucose, UA: NEGATIVE mg/dL
Ketones, ur: NEGATIVE mg/dL
Protein, ur: NEGATIVE mg/dL

## 2011-04-23 LAB — URINE MICROSCOPIC-ADD ON

## 2011-04-23 NOTE — ED Notes (Signed)
Pt in with c/o rectal bleeding states blood was bright red after attempting BM this am

## 2011-04-23 NOTE — Discharge Instructions (Signed)
Please read and follow all provided instructions.  Your diagnoses today include:  1. Rectal bleeding     Tests performed today include:  Blood counts which shows you have not lost too much blood  Stool test looking for blood, none was found at time of exam  Vital signs. See below for your results today.   Medications prescribed:   None  Take any prescribed medications only as directed.  Home care instructions:  Follow any educational materials contained in this packet.  Follow-up instructions: Please follow-up with the stomach doctor (GI doctor) listed for further evaluation of your symptoms.   If you do not have a primary care doctor -- see below for referral information.   Return instructions:   Please return to the Emergency Department if you experience worsening symptoms.   Return with worsening pain, fever, worsening bleeding, persistent vomiting.  Please return if you have any other emergent concerns.  Additional Information:  Your vital signs today were: BP 140/88  Pulse 75  Temp(Src) 98.8 F (37.1 C) (Oral)  Resp 18  SpO2 100%  LMP 02/27/2011 If your blood pressure (BP) was elevated above 135/85 this visit, please have this repeated by your doctor within one month. -------------- No Primary Care Doctor Call [facility]  [phone] Other agencies that provide inexpensive medical care    [facility]  [phone]    [facility]  [phone]    [facility]  [phone]    [facility]  [phone]    [facility]  [phone]    [facility]  [phone] -------------- RESOURCE GUIDE:  Dental Problems  Patients with Medicaid: [facility] [id number]                                            [phone] [address] Phone:  [phone]                                                   Phone:  [phone]  If unable to pay or uninsured, contact:  [facility] or [facility]. to become qualified for the adult dental clinic.  Chronic Pain Problems Contact [facility]  [phone] Patients need to be referred by their primary care doctor.  Insufficient Money for Medicine Contact [employer]:  call "211" or [facility] [phone].  Psychological Services [facility]  [phone] [facility]  [phone] [facility]   [phone] (emergency services [phone])  Substance Abuse Resources [facility]  [phone] [facility] [phone] [facility] [place] [phone] Floydene Flock [phone] Residential & Outpatient Substance Abuse Program  [phone]  Abuse/Neglect [facility] Child Abuse Hotline [phone] [facility] Child Abuse Hotline 731-041-0737 (After Hours)  Emergency Shelter [facility] [phone]  Maternity Homes Room at the [place] [facility] [phone] [facility] [phone]  [facility] [place]  [facility] [address] [place]                       [address]      [address]  [place]                                                [place]                            [place] Phone:  [phone]                                   Phone:  [phone]                 Phone:  [phone]  [facility] Phone:  [phone]  [place] Child Abuse Hotline [phone] (After Hours)    

## 2011-04-23 NOTE — ED Provider Notes (Signed)
History     CSN: [id number]  Arrival date & time 04/23/11  1100   First MD Initiated Contact with Patient 04/23/11 1316      Chief Complaint  Patient presents with  . Rectal Bleeding    this am     (Consider location/radiation/quality/duration/timing/severity/associated sxs/prior treatment) HPI Comments: Patient presents with intermittent passage of right red blood per rectum that has been associated with mild to moderate left lower quadrant abdominal pain for the past 3-4 days. Patient states that the blood is mixed with her stool. Patient states that she has has noted blood in her stool should she was a child. She's never had a formal workup for this and she does not have a history of hemorrhoids. No treatments prior to arrival. Nothing makes condition better or worse. No previous colonoscopies. No history of bleeding problems or blood thinning medication. Patient smokes cigarettes. Patient denies fever, nausea, vomiting, shortness of breath, diarrhea, blood in her urine. Patient states that she is not a period in several months. She does not think that she is pregnant.  Patient is a 41 y.o. female presenting with hematochezia. The history is provided by the patient.  Rectal Bleeding  The current episode started today. The problem has been unchanged. The patient is experiencing no pain. The stool is described as mixed with blood. Associated symptoms include abdominal pain. Pertinent negatives include no anorexia, no fever, no diarrhea, no hemorrhoids, no nausea, no rectal pain, no vomiting, no hematuria, no vaginal bleeding, no chest pain, no headaches, no coughing and no rash.    Past Medical History  Diagnosis Date  . No pertinent past medical history     Past Surgical History  Procedure Date  . Dilation and curettage of uterus   . Wisdom tooth extraction     No family history on file.  History  Substance Use Topics  . Smoking status: Current Everyday Smoker -- 0.2 packs/day  for 15 years    Types: Cigars  . Smokeless tobacco: Never Used  . Alcohol Use: Yes    OB History    Grav Para Term Preterm Abortions TAB SAB Ect Mult Living   1    1 1           Review of Systems  Constitutional: Negative for fever.  HENT: Negative for sore throat and rhinorrhea.   Eyes: Negative for redness.  Respiratory: Negative for cough.   Cardiovascular: Negative for chest pain.  Gastrointestinal: Positive for abdominal pain, blood in stool and hematochezia. Negative for nausea, vomiting, diarrhea, rectal pain, anorexia and hemorrhoids.  Genitourinary: Negative for dysuria, hematuria and vaginal bleeding.  Musculoskeletal: Negative for myalgias.  Skin: Negative for rash.  Neurological: Negative for syncope and headaches.    Allergies  Review of patient's allergies indicates no known allergies.  Home Medications  No current outpatient prescriptions on file.  BP 140/88  Pulse 75  Temp(Src) 98.8 F (37.1 C) (Oral)  Resp 18  SpO2 100%  LMP 02/27/2011  Physical Exam  Nursing note and vitals reviewed. Constitutional: She is oriented to person, place, and time. She appears well-developed and well-nourished.  HENT:  Head: Normocephalic and atraumatic.  Eyes: Conjunctivae are normal. Right eye exhibits no discharge. Left eye exhibits no discharge.  Neck: Normal range of motion. Neck supple.  Cardiovascular: Normal rate, regular rhythm and normal heart sounds.   Pulmonary/Chest: Effort normal and breath sounds normal.  Abdominal: Soft. There is tenderness in the left lower quadrant. There is no rebound,  no guarding, no CVA tenderness, no tenderness at McBurney's point and negative Murphy's sign.       Tenderness is mild  Genitourinary: Rectal exam shows external hemorrhoid and internal hemorrhoid. Rectal exam shows no fissure, no tenderness and anal tone normal.  Musculoskeletal: Normal range of motion.  Neurological: She is alert and oriented to person, place, and  time.  Skin: Skin is warm and dry.  Psychiatric: She has a normal mood and affect.    ED Course  Procedures (including critical care time)  Labs Reviewed  CBC - Abnormal; Notable for the following:    HCT 35.8 (*)    All other components within normal limits  DIFFERENTIAL - Abnormal; Notable for the following:    Lymphocytes Relative 47 (*)    Lymphs Abs 4.1 (*)    All other components within normal limits  URINALYSIS, ROUTINE W REFLEX MICROSCOPIC - Abnormal; Notable for the following:    Hgb urine dipstick SMALL (*)    All other components within normal limits  URINE MICROSCOPIC-ADD ON - Abnormal; Notable for the following:    Squamous Epithelial / LPF FEW (*)    Bacteria, UA FEW (*)    All other components within normal limits  COMPREHENSIVE METABOLIC PANEL  OCCULT BLOOD, POC DEVICE  POCT PREGNANCY, URINE   No results found.   1. Rectal bleeding     1:39 PM Patient seen and examined. Work-up initiated.  Vital signs reviewed and are as follows: Filed Vitals:   04/23/11 1115  BP: 140/88  Pulse: 75  Temp: 98.8 F (37.1 C)  Resp: 18   Patient was informed of results. Urged to follow-up with GI or PCP.   The patient was urged to return to the Emergency Department immediately with worsening of current symptoms, worsening abdominal pain, persistent vomiting, blood noted in stools, fever, or any other concerns. The patient verbalized understanding.   She verbalizes understanding and agrees with plan.   MDM  Rectal bleed, no active bleeding at time of exam. Patient has non-thrombosed hemorrhoids noted. Diverticulitis also considered, however pain is mild, WBC is normal. Labs are otherwise non-concerning. GI follow-up indicated with return if worsening. Patient is well and stable at time of discharge.         Renne Crigler, [place] 04/23/11 [phone]

## 2011-04-25 NOTE — ED Provider Notes (Signed)
Medical screening examination/treatment/procedure(s) were performed by non-physician practitioner and as supervising physician I was immediately available for consultation/collaboration.  Imogean Ciampa, MD 04/25/11 1320 

## 2011-07-11 ENCOUNTER — Emergency Department (HOSPITAL_COMMUNITY)
Admission: EM | Admit: 2011-07-11 | Discharge: 2011-07-11 | Disposition: A | Discharge status: Home / Self Care | Payer: Self-pay | Attending: Emergency Medicine | Admitting: Emergency Medicine

## 2011-07-11 ENCOUNTER — Encounter (HOSPITAL_COMMUNITY): Payer: Self-pay | Admitting: Emergency Medicine

## 2011-07-11 DIAGNOSIS — R609 Edema, unspecified: Secondary | ICD-10-CM | POA: Insufficient documentation

## 2011-07-11 DIAGNOSIS — R600 Localized edema: Secondary | ICD-10-CM

## 2011-07-11 DIAGNOSIS — F17200 Nicotine dependence, unspecified, uncomplicated: Secondary | ICD-10-CM | POA: Insufficient documentation

## 2011-07-11 LAB — POCT I-STAT, CHEM 8
BUN: 12 mg/dL (ref 6–23)
Chloride: 103 mEq/L (ref 96–112)
Potassium: 3.5 mEq/L (ref 3.5–5.1)
Sodium: 142 mEq/L (ref 135–145)

## 2011-07-11 NOTE — Progress Notes (Signed)
Self pay with no pcp [place] resident confirmed Pt seen by [facility] and provided with information about obtaining a local pcp

## 2011-07-11 NOTE — ED Provider Notes (Signed)
History     CSN: [id number]  Arrival date & time 07/11/11  1346   First MD Initiated Contact with Patient 07/11/11 1539      Chief Complaint  Patient presents with  . Leg Swelling  . Foot Swelling    (Consider location/radiation/quality/duration/timing/severity/associated sxs/prior treatment) HPI  Pt to the ED complaining of bilateral lower extremity swelling since Saturday. She denies having a history of the same. She denies having any leg pain. She denies one leg being worse than the other, she denies CP or shortness of breath. The patient states that her legs tingle when she walks. She admits that the swelling is better in the morning and progressively gets worse as the day goes on. She denies diabetes or hx of heart disease. She admits to being on her feet a lot for work.   Past Medical History  Diagnosis Date  . No pertinent past medical history     Past Surgical History  Procedure Date  . Dilation and curettage of uterus   . Wisdom tooth extraction     No family history on file.  History  Substance Use Topics  . Smoking status: Current Everyday Smoker -- 0.2 packs/day for 15 years    Types: Cigars  . Smokeless tobacco: Never Used  . Alcohol Use: Yes    OB History    Grav Para Term Preterm Abortions TAB SAB Ect Mult Living   1    1 1           Review of Systems   HEENT: denies blurry vision or change in hearing PULMONARY: Denies difficulty breathing and SOB CARDIAC: denies chest pain or heart palpitations MUSCULOSKELETAL:  denies being unable to ambulate ABDOMEN AL: denies abdominal pain GU: denies loss of bowel or urinary control NEURO: denies numbness and tingling in extremities SKIN: no new rashes PSYCH: patient behavior is normal NECK: Not complaining of neck pain     Allergies  Review of patient's allergies indicates no known allergies.  Home Medications  No current outpatient prescriptions on file.  BP 134/71  Pulse 62  Temp 98.4 F  (36.9 C) (Oral)  Resp 18  Ht 5\' 7"  (1.702 m)  Wt 202 lb (91.627 kg)  BMI 31.64 kg/m2  SpO2 100%  LMP 06/03/2011  Physical Exam  Nursing note and vitals reviewed. Constitutional: She appears well-developed and well-nourished. No distress.  HENT:  Head: Normocephalic and atraumatic.  Eyes: Pupils are equal, round, and reactive to light.  Neck: Normal range of motion. Neck supple.  Cardiovascular: Normal rate and regular rhythm.   Pulmonary/Chest: Effort normal.  Abdominal: Soft.  Musculoskeletal:       Right shoulder: She exhibits swelling. She exhibits no tenderness, no crepitus, no laceration, no pain and normal pulse.       Symmetrical bilateral ankle swelling. Without signs of induration, redness, crepitus. No tenderness to palpation. Edema is mild.  Normal pulses bilaterally.  Neurological: She is alert.  Skin: Skin is warm and dry.    ED Course  Procedures (including critical care time)  Labs Reviewed  POCT I-STAT, CHEM 8 - Abnormal; Notable for the following:    Glucose, Bld 114 (*)     Hemoglobin 11.6 (*)     HCT 34.0 (*)     All other components within normal limits  LAB REPORT - SCANNED   No results found.   1. Lower extremity edema       MDM  I-stat drawn shows no acute abnormalities. I  have discussed decrease in salt intake and the use of compression stalkings.         Dorthula Matas, PA 07/14/11 [phone]

## 2011-07-11 NOTE — ED Notes (Signed)
Per pt, states feet/leg swelling since Saturday

## 2011-07-11 NOTE — Discharge Instructions (Signed)
Peripheral Edema You have swelling in your legs (peripheral edema). This swelling is due to excess accumulation of salt and water in your body. Edema may be a sign of heart, kidney or liver disease, or a side effect of a medication. It may also be due to problems in the leg veins. Elevating your legs and using special support stockings may be very helpful, if the cause of the swelling is due to poor venous circulation. Avoid long periods of standing, whatever the cause. Treatment of edema depends on identifying the cause. Chips, pretzels, pickles and other salty foods should be avoided. Restricting salt in your diet is almost always needed. Water pills (diuretics) are often used to remove the excess salt and water from your body via urine. These medicines prevent the kidney from reabsorbing sodium. This increases urine flow. Diuretic treatment may also result in lowering oEdema Edema is an abnormal build-up of fluids in tissues. Because this is partly dependent on gravity (water flows to the lowest place), it is more common in the leg sand thighs (lower extremities). It is also common in the looser tissues, like around the eyes. Painless swelling of the feet and ankles is common and increases as a person ages. It may affect both legs and may include the calves or even thighs. When squeezed, the fluid may move out of the affected area and may leave a dent for a few moments. CAUSES   Prolonged standing or sitting in one place for extended periods of time. Movement helps pump tissue fluid into the veins, and absence of movement prevents this, resulting in edema.   Varicose veins. The valves in the veins do not work as well as they should. This causes fluid to leak into the tissues.   Fluid and salt overload.   Injury, burn, or surgery to the leg, ankle, or foot, may damage veins and allow fluid to leak out.   Sunburn damages vessels. Leaky vessels allow fluid to go out into the sunburned tissues.    Allergies (from insect bites or stings, medications or chemicals) cause swelling by allowing vessels to become leaky.   Protein in the blood helps keep fluid in your vessels. Low protein, as in malnutrition, allows fluid to leak out.   Hormonal changes, including pregnancy and menstruation, cause fluid retention. This fluid may leak out of vessels and cause edema.   Medications that cause fluid retention. Examples are sex hormones, blood pressure medications, steroid treatment, or anti-depressants.   Some illnesses cause edema, especially heart failure, kidney disease, or liver disease.   Surgery that cuts veins or lymph nodes, such as surgery done for the heart or for breast cancer, may result in edema.  DIAGNOSIS  Your caregiver is usually easily able to determine what is causing your swelling (edema) by simply asking what is wrong (getting a history) and examining you (doing a physical). Sometimes x-rays, EKG (electrocardiogram or heart tracing), and blood work may be done to evaluate for underlying medical illness. TREATMENT  General treatment includes:  Leg elevation (or elevation of the affected body part).   Restriction of fluid intake.   Prevention of fluid overload.   Compression of the affected body part. Compression with elastic bandages or support stockings squeezes the tissues, preventing fluid from entering and forcing it back into the blood vessels.   Diuretics (also called water pills or fluid pills) pull fluid out of your body in the form of increased urination. These are effective in reducing the swelling, but  can have side effects and must be used only under your caregiver's supervision. Diuretics are appropriate only for some types of edema.  The specific treatment can be directed at any underlying causes discovered. Heart, liver, or kidney disease should be treated appropriately. HOME CARE INSTRUCTIONS   Elevate the legs (or affected body part) above the level of  the heart, while lying down.   Avoid sitting or standing still for prolonged periods of time.   Avoid putting anything directly under the knees when lying down, and do not wear constricting clothing or garters on the upper legs.   Exercising the legs causes the fluid to work back into the veins and lymphatic channels. This may help the swelling go down.   The pressure applied by elastic bandages or support stockings can help reduce ankle swelling.   A low-salt diet may help reduce fluid retention and decrease the ankle swelling.   Take any medications exactly as prescribed.  SEEK MEDICAL CARE IF:  Your edema is not responding to recommended treatments. SEEK IMMEDIATE MEDICAL CARE IF:   You develop shortness of breath or chest pain.   You cannot breathe when you lay down; or if, while lying down, you have to get up and go to the window to get your breath.   You are having increasing swelling without relief from treatment.   You develop a fever over 102 F (38.9 C).   You develop pain or redness in the areas that are swollen.   Tell your caregiver right away if you have gained 3 lb/1.4 kg in 1 day or 5 lb/2.3 kg in a week.  MAKE SURE YOU:   Understand these instructions.   Will watch your condition.   Will get help right away if you are not doing well or get worse.  Document Released: 12/31/2004 Document Revised: 12/20/2010 Document Reviewed: 08/19/2007 [facility] Patient Information 2012 [place], [place].f potassium levels in your body. Potassium supplements may be needed if you have to use diuretics daily. Daily weights can help you keep track of your progress in clearing your edema. You should call your caregiver for follow up care as recommended. SEEK IMMEDIATE MEDICAL CARE IF:   You have increased swelling, pain, redness, or heat in your legs.   You develop shortness of breath, especially when lying down.   You develop chest or abdominal pain, weakness, or fainting.    You have a fever.  Document Released: 02/08/2004 Document Revised: 12/20/2010 Document Reviewed: 01/18/2009 [facility] Patient Information 2012 [place], [place].

## 2011-07-18 NOTE — ED Provider Notes (Signed)
Medical screening examination/treatment/procedure(s) were performed by non-physician practitioner and as supervising physician I was immediately available for consultation/collaboration.    Nelia Shi, MD 07/18/11 2312

## 2011-11-12 ENCOUNTER — Other Ambulatory Visit (HOSPITAL_COMMUNITY): Payer: Self-pay | Admitting: Physician Assistant

## 2011-11-12 DIAGNOSIS — Z1231 Encounter for screening mammogram for malignant neoplasm of breast: Secondary | ICD-10-CM

## 2011-12-06 ENCOUNTER — Ambulatory Visit (HOSPITAL_COMMUNITY)
Admission: RE | Admit: 2011-12-06 | Discharge: 2011-12-06 | Disposition: A | Discharge status: Home / Self Care | Payer: Self-pay | Source: Ambulatory Visit | Attending: Physician Assistant | Admitting: Physician Assistant

## 2011-12-06 DIAGNOSIS — Z1231 Encounter for screening mammogram for malignant neoplasm of breast: Secondary | ICD-10-CM

## 2012-06-04 ENCOUNTER — Telehealth: Payer: Self-pay | Admitting: *Deleted

## 2012-06-04 ENCOUNTER — Ambulatory Visit (INDEPENDENT_AMBULATORY_CARE_PROVIDER_SITE_OTHER): Payer: Managed Care, Other (non HMO) | Admitting: Women's Health

## 2012-06-04 ENCOUNTER — Encounter: Payer: Self-pay | Admitting: Women's Health

## 2012-06-04 ENCOUNTER — Other Ambulatory Visit (HOSPITAL_COMMUNITY)
Admission: RE | Admit: 2012-06-04 | Discharge: 2012-06-04 | Disposition: A | Discharge status: Home / Self Care | Payer: Managed Care, Other (non HMO) | Source: Ambulatory Visit | Attending: Women's Health | Admitting: Women's Health

## 2012-06-04 ENCOUNTER — Encounter: Payer: Self-pay | Admitting: Gastroenterology

## 2012-06-04 VITALS — BP 122/70 | Ht 66.0 in | Wt 192.0 lb

## 2012-06-04 DIAGNOSIS — K625 Hemorrhage of anus and rectum: Secondary | ICD-10-CM

## 2012-06-04 DIAGNOSIS — Z833 Family history of diabetes mellitus: Secondary | ICD-10-CM

## 2012-06-04 DIAGNOSIS — Z01419 Encounter for gynecological examination (general) (routine) without abnormal findings: Secondary | ICD-10-CM

## 2012-06-04 DIAGNOSIS — Z113 Encounter for screening for infections with a predominantly sexual mode of transmission: Secondary | ICD-10-CM

## 2012-06-04 DIAGNOSIS — N912 Amenorrhea, unspecified: Secondary | ICD-10-CM

## 2012-06-04 LAB — CBC WITH DIFFERENTIAL/PLATELET
Eosinophils Absolute: 0.5 10*3/uL (ref 0.0–0.7)
Eosinophils Relative: 6 % — ABNORMAL HIGH (ref 0–5)
Lymphs Abs: 3.1 10*3/uL (ref 0.7–4.0)
MCH: 30.4 pg (ref 26.0–34.0)
MCV: 91 fL (ref 78.0–100.0)
Platelets: 419 10*3/uL — ABNORMAL HIGH (ref 150–400)
RBC: 3.98 MIL/uL (ref 3.87–5.11)

## 2012-06-04 NOTE — Telephone Encounter (Signed)
Appt. On 6//20/14 with Dr.Jacobs. Pt informed with the below note.

## 2012-06-04 NOTE — Telephone Encounter (Signed)
Message copied by Aura Camps on Thu Jun 04, 2012  2:04 PM ------      Message from: [place], [place] J      Created: Thu Jun 04, 2012  1:01 PM       Alyssa Butler, pt. needs referral.       Patient is having rectal bleeding every month to every 2 months for past year. Denies constipation. No visible hemorrhoids noted. Has been seen at [facility] in the past but does not recall Dr. she saw. Has had GI problems for years. No colon Cancer history in family. Best day for appointment is either Thursday or Fridays but not 06/11/12.            thanks ------

## 2012-06-04 NOTE — Patient Instructions (Signed)

## 2012-06-04 NOTE — Progress Notes (Addendum)
Alyssa Butler 14-Mar-1970 [id number]    History:    The patient presents for annual exam.  Has not been seen in our office since 2008. Has received healthcare at health Department due to lack of insurance. Reports Paps and mammograms as normal. Cycles became increasingly irregular 2 years ago, this past year one cycle in March. Rare intercourse, occasional condoms. Has had GI issues for years but has noticed painless blood in stool off and on every other month to every month for past year without constipation.has been seen at [facility] in the past.   Past medical history, past surgical history, family history and social history were all reviewed and documented in the EPIC chart. 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   ROS:  A  ROS was performed and pertinent positives and negatives are included in the history.  Exam:  Filed Vitals:   06/04/12 1003  BP: 122/70    General appearance:  Normal Head/Neck:  Normal, without cervical or supraclavicular adenopathy. Thyroid:  Symmetrical, normal in size, without palpable masses or nodularity. Respiratory  Effort:  Normal  Auscultation:  Clear without wheezing or rhonchi Cardiovascular  Auscultation:  Regular rate, without rubs, murmurs or gallops  Edema/varicosities:  Not grossly evident Abdominal  Soft,nontender, without masses, guarding or rebound.  Liver/spleen:  No organomegaly noted  Hernia:  None appreciated  Skin  Inspection:  Grossly normal  Palpation:  Grossly normal Neurologic/psychiatric  Orientation:  Normal with appropriate conversation.  Mood/affect:  Normal  Genitourinary    Breasts: Examined lying and sitting.     Right: Without masses, retractions, discharge or axillary adenopathy.     Left: Without masses, retractions, discharge or axillary adenopathy.   Inguinal/mons:  Normal without inguinal adenopathy  External genitalia:  Normal  BUS/Urethra/Skene's glands:  Normal  Bladder:  Normal  Vagina:   Normal  Cervix:  Normal  Uterus:   normal in size, shape and contour.  Midline and mobile  Adnexa/parametria:     Rt: Without masses or tenderness.   Lt: Without masses or tenderness.  Anus and perineum: Normal  Digital rectal exam: Normal sphincter tone without palpated masses or tenderness,no noted hemorrhoids no blood with exam  Assessment/Plan:  42 y.o. SBF G1 P0 for annual exam.     Painless occasional rectal bleeding Amenorrhea STD screen Smoker overweight  Plan: GI referral has been seen at [facility] in the past. FSH, CBC, glucose, UA, Pap, GC/Chlamydia, HIV, hep, B,C, RPR. Contraception options reviewed and declined will continue condoms or abstinence. SBE's, continue annual mammogram, calcium rich diet, vitamin D 1000 daily and exercise encouraged. Smokes cigars, reviewed importance of decreasing/quitting for health. Reviewed importance of increasing exercise and decreasing calories for weight loss and health.    Harrington Challenger [facility], 12:39 PM 06/04/2012

## 2012-06-05 LAB — URINALYSIS W MICROSCOPIC + REFLEX CULTURE
Casts: NONE SEEN
Glucose, UA: NEGATIVE mg/dL
Leukocytes, UA: NEGATIVE
Protein, ur: NEGATIVE mg/dL
Specific Gravity, Urine: 1.027 (ref 1.005–1.030)
Squamous Epithelial / LPF: NONE SEEN
pH: 6 (ref 5.0–8.0)

## 2012-06-05 LAB — HEPATITIS C ANTIBODY: HCV Ab: NEGATIVE

## 2012-06-05 LAB — HIV ANTIBODY (ROUTINE TESTING W REFLEX): HIV: NONREACTIVE

## 2012-06-05 LAB — GC/CHLAMYDIA PROBE AMP
CT Probe RNA: NEGATIVE
GC Probe RNA: NEGATIVE

## 2012-06-05 LAB — RPR

## 2012-07-03 ENCOUNTER — Encounter: Payer: Self-pay | Admitting: Gastroenterology

## 2012-07-03 ENCOUNTER — Ambulatory Visit (INDEPENDENT_AMBULATORY_CARE_PROVIDER_SITE_OTHER): Payer: Managed Care, Other (non HMO) | Admitting: Gastroenterology

## 2012-07-03 VITALS — BP 100/68 | HR 80 | Ht 66.0 in | Wt 199.4 lb

## 2012-07-03 DIAGNOSIS — K5900 Constipation, unspecified: Secondary | ICD-10-CM

## 2012-07-03 DIAGNOSIS — K625 Hemorrhage of anus and rectum: Secondary | ICD-10-CM

## 2012-07-03 MED ORDER — MOVIPREP 100 G PO SOLR: 1.0000 | Freq: Once | ORAL | Status: DC

## 2012-07-03 NOTE — Progress Notes (Signed)
HPI: This is a very pleasant 42 year old woman whom I am meeting for the first time today.   Has had rectal bleeding. This has been going on for 15-20 years, only inttermittently.  Can be high volume at times.  Will stop after 2-3 days.    She went to [facility] or [facility] in past, she said "some type of specimine was done" but no blood was in her stool.    She does think she had a colonoscopy in her teens.  No FH of colon cancer, polyps.  She tends to be constipated. Sometimes will not have a BM in a week. Usually will have 2 BMS in a week with a lot of straining only.  She takes stool softners PRN, didn't help.  Has not tried fiber supplement.  Overall she has gained a bit of weight lately.  CBC last month was normal   Review of systems: Pertinent positive and negative review of systems were noted in the above HPI section. Complete review of systems was performed and was otherwise normal.    Past Medical History  Diagnosis Date  . Fibroid     Past Surgical History  Procedure Laterality Date  . Wisdom tooth extraction    . Therapeutic abortion      Current Outpatient Prescriptions  Medication Sig Dispense Refill  . IBUPROFEN PO Take by mouth.       No current facility-administered medications for this visit.    Allergies as of 07/03/2012  . (No Known Allergies)    Family History  Problem Relation Age of Onset  . Heart attack Maternal Uncle   . Kidney failure Maternal Grandmother   . Heart attack Maternal Grandfather     History   Social History  . Marital Status: Single    Spouse Name: N/A    Number of Children: N/A  . Years of Education: N/A   Occupational History  . Not on file.   Social History Main Topics  . Smoking status: Current Every Day Smoker -- 0.25 packs/day for 15 years    Types: Cigars  . Smokeless tobacco: Never Used  . Alcohol Use: 1.5 oz/week    3 drink(s) per week  . Drug Use: No  . Sexually Active: No   Other Topics Concern  . Not  on file   Social History Narrative  . No narrative on file       Physical Exam: BP 100/68  Pulse 80  Ht 5\' 6"  (1.676 m)  Wt 199 lb 6 oz (90.436 kg)  BMI 32.2 kg/m2  LMP 04/06/2012 Constitutional: generally well-appearing Psychiatric: alert and oriented x3 Eyes: extraocular movements intact Mouth: oral pharynx moist, no lesions Neck: supple no lymphadenopathy Cardiovascular: heart regular rate and rhythm Lungs: clear to auscultation bilaterally Abdomen: soft, nontender, nondistended, no obvious ascites, no peritoneal signs, normal bowel sounds Extremities: no lower extremity edema bilaterally Skin: no lesions on visible extremities    Assessment and plan: 42 y.o. female with  chronic constipation, mild intermittent rectal bleeding for many years  She has never tried fiber supplements and I recommended that as a first line treatment for her chronic mild constipation. I suspect will help with her abdominal discomforts that are probably constipation related. Can also help relieve any anal rectal problems such as hemorrhoids it might be going on. I also advised that she should have a colonoscopy at her soonest convenience given her rectal bleeding.

## 2012-07-03 NOTE — Patient Instructions (Addendum)
One of your biggest health concerns is your smoking.  This increases your risk for most cancers and serious cardiovascular diseases such as strokes, heart attacks.  You should try your best to stop.  If you need assistance, please contact your PCP or Smoking Cessation Class at [facility] [phone]) or [facility] Quit-Line (1-800-QUIT-NOW). Please start once daily fiber supplement (OTC). You will be set up for a colonoscopy (LEC, moderate sedation) for your bleeding, constipation.                                               We are excited to introduce MyChart, a new best-in-class service that provides you online access to important information in your electronic medical record. We want to make it easier for you to view your health information - all in one secure location - when and where you need it. We expect MyChart will enhance the quality of care and service we provide.  When you register for MyChart, you can:    View your test results.    Request appointments and receive appointment reminders via email.    Request medication renewals.    View your medical history, allergies, medications and immunizations.    Communicate with your physician's office through a password-protected site.    Conveniently print information such as your medication lists.  To find out if MyChart is right for you, please talk to a member of our clinical staff today. We will gladly answer your questions about this free health and wellness tool.  If you are age 44 or older and want a member of your family to have access to your record, you must provide written consent by completing a proxy form available at our office. Please speak to our clinical staff about guidelines regarding accounts for patients younger than age 30.  As you activate your MyChart account and need any technical assistance, please call the MyChart technical support line at [phone]) or email your question to  [email or web address]. If you email your question(s), please include your name, a return phone number and the best time to reach you.  If you have non-urgent health-related questions, you can send a message to our office through MyChart at [email or web address]. If you have a medical emergency, call 911.  Thank you for using MyChart as your new health and wellness resource!   MyChart licensed from [employer],  [id number]. Patents Pending.

## 2012-07-08 ENCOUNTER — Ambulatory Visit: Payer: Managed Care, Other (non HMO) | Admitting: Gynecology

## 2012-07-10 ENCOUNTER — Ambulatory Visit: Payer: Managed Care, Other (non HMO) | Admitting: Gynecology

## 2012-07-13 ENCOUNTER — Other Ambulatory Visit: Payer: Managed Care, Other (non HMO) | Admitting: Gastroenterology

## 2012-07-14 DIAGNOSIS — IMO0002 Reserved for concepts with insufficient information to code with codable children: Secondary | ICD-10-CM

## 2012-07-14 HISTORY — DX: Reserved for concepts with insufficient information to code with codable children: IMO0002

## 2012-07-20 ENCOUNTER — Ambulatory Visit: Payer: Managed Care, Other (non HMO)

## 2012-07-20 ENCOUNTER — Ambulatory Visit (INDEPENDENT_AMBULATORY_CARE_PROVIDER_SITE_OTHER): Payer: Managed Care, Other (non HMO) | Admitting: Family Medicine

## 2012-07-20 VITALS — BP 126/74 | HR 64 | Temp 98.0°F | Resp 17 | Ht 66.5 in | Wt 200.0 lb

## 2012-07-20 DIAGNOSIS — M25511 Pain in right shoulder: Secondary | ICD-10-CM

## 2012-07-20 DIAGNOSIS — M25529 Pain in unspecified elbow: Secondary | ICD-10-CM

## 2012-07-20 DIAGNOSIS — M25519 Pain in unspecified shoulder: Secondary | ICD-10-CM

## 2012-07-20 DIAGNOSIS — M25521 Pain in right elbow: Secondary | ICD-10-CM

## 2012-07-20 DIAGNOSIS — R21 Rash and other nonspecific skin eruption: Secondary | ICD-10-CM

## 2012-07-20 MED ORDER — DICLOFENAC SODIUM 75 MG PO TBEC: 75.0000 mg | DELAYED_RELEASE_TABLET | Freq: Two times a day (BID) | ORAL | Status: DC

## 2012-07-20 MED ORDER — DOXYCYCLINE HYCLATE 100 MG PO TABS: 100.0000 mg | ORAL_TABLET | Freq: Two times a day (BID) | ORAL | Status: DC

## 2012-07-20 MED ORDER — TRIAMCINOLONE ACETONIDE 0.1 % EX CREA: TOPICAL_CREAM | Freq: Two times a day (BID) | CUTANEOUS | Status: DC

## 2012-07-20 MED ORDER — PERMETHRIN 1 % EX LOTN: TOPICAL_LOTION | CUTANEOUS | Status: DC

## 2012-07-20 NOTE — Patient Instructions (Addendum)
Take doxycycline twice daily for rash  Apply permethrin topical lotion from neck to feet at bedtime.  Wash in the AM and change your sheets and bedclothing.  This is in case this is scabies, which is not likely.  Triamcinolone cream to use twice daily on the itchy rash\  After your colonoscopy if still itching take a course of diclofenac twice daily with food for your joints.  In the meanwhile take tylenol 2 pills twice daily for pain.

## 2012-07-20 NOTE — Progress Notes (Signed)
Subjective: Patient is here with several concerns. She's been hurting in her right shoulder and right elbow for some time now, most of this year. It comes and goes. No specific injury but she did have some bipolar are back one time at the job a couple of months ago. She also has had a rash over the past couple of weeks around the right side of her chest from back around underneath her breasts. There is some on her arm also. It itches.  Objective: Right shoulder and elbow have good range of motion with no crepitance. Strength seems good. No good point tenderness. She has a mild follicular rash around the right side of the chest just reaching underneath the right breast. This 2 places on her left posterior chest, and if you are right arm. Legs look fine. A couple of little bumps right breast.  Assessment Right shoulder pain Right elbow pain Nonspecific follicular dermatitis, possibly scabies  Plan: X-ray elbow and shoulder  [facility] reading (PRIMARY) by  Dr. Alwyn Ren Normal shoulder and elbow.  Assessment: Possible scabies, will cover for scabies as well as, for a nonspecific folliculitis. Treat with permethrin, doxycycline, and some triamcinolone cream. Will give her an NSAID to use for the pain in the elbow and shoulder. However she can start this until after colonoscopy next week. If they find a risk for bleeding she's not taken at all. She takes Tylenol.

## 2012-07-22 ENCOUNTER — Ambulatory Visit: Payer: Managed Care, Other (non HMO)

## 2012-07-22 ENCOUNTER — Ambulatory Visit (INDEPENDENT_AMBULATORY_CARE_PROVIDER_SITE_OTHER): Payer: Managed Care, Other (non HMO) | Admitting: Gynecology

## 2012-07-22 ENCOUNTER — Encounter: Payer: Self-pay | Admitting: Gynecology

## 2012-07-22 DIAGNOSIS — IMO0002 Reserved for concepts with insufficient information to code with codable children: Secondary | ICD-10-CM

## 2012-07-22 DIAGNOSIS — R6889 Other general symptoms and signs: Secondary | ICD-10-CM

## 2012-07-22 NOTE — Progress Notes (Signed)
Patient ID: Alyssa Butler, female   DOB: [birth year], 42 y.o.   MRN: [id number] Patient presents with first abnormal Pap smear showing LGSIL. No history of abnormal Pap smears previously. Does note new partner in January.  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 External BUS vagina normal. Cervix grossly normal. Uterus normal size mobile nontender. Adnexa without masses or tenderness. Colposcopy adequate after acetic acid cleanse with manipulation to see the entire transformation. Area of acetowhite change 12:00 transformation zone. Biopsy taken. ECC performed. Physical Exam  Genitourinary:      Assessment and plan: First abnormal Pap smear with LGSIL. Colposcopy shows acetowhite change at 12:00. Biopsy taken with ECC. I had a lengthy discussion about cervical dysplasia and its association with HPV. The issues of progression/regression, treatment versus observation all reviewed. Patient will followup for her biopsy results and we'll go from there.

## 2012-07-22 NOTE — Patient Instructions (Signed)
Office will call you with the biopsy results 

## 2012-07-26 ENCOUNTER — Encounter: Payer: Self-pay | Admitting: Gastroenterology

## 2012-07-29 ENCOUNTER — Ambulatory Visit (AMBULATORY_SURGERY_CENTER): Payer: Managed Care, Other (non HMO) | Admitting: Gastroenterology

## 2012-07-29 ENCOUNTER — Encounter: Payer: Self-pay | Admitting: Gastroenterology

## 2012-07-29 VITALS — BP 123/79 | HR 71 | Temp 99.3°F | Resp 16 | Ht 66.0 in | Wt 199.0 lb

## 2012-07-29 DIAGNOSIS — K644 Residual hemorrhoidal skin tags: Secondary | ICD-10-CM

## 2012-07-29 DIAGNOSIS — D126 Benign neoplasm of colon, unspecified: Secondary | ICD-10-CM

## 2012-07-29 DIAGNOSIS — K5900 Constipation, unspecified: Secondary | ICD-10-CM

## 2012-07-29 HISTORY — DX: Benign neoplasm of colon, unspecified: D12.6

## 2012-07-29 MED ORDER — SODIUM CHLORIDE 0.9 % IV SOLN: 500.0000 mL | INTRAVENOUS | Status: DC

## 2012-07-29 NOTE — Progress Notes (Signed)
Patient did not experience any of the following events: a burn prior to discharge; a fall within the facility; wrong site/side/patient/procedure/implant event; or a hospital transfer or hospital admission upon discharge from the facility. (G8907) Patient did not have preoperative order for IV antibiotic SSI prophylaxis. (G8918)  

## 2012-07-29 NOTE — Patient Instructions (Addendum)
YOU HAD AN ENDOSCOPIC PROCEDURE TODAY AT THE [facility]: Refer to the procedure report that was given to you for any specific questions about what was found during the examination.  If the procedure report does not answer your questions, please call your gastroenterologist to clarify.  If you requested that your care partner not be given the details of your procedure findings, then the procedure report has been included in a sealed envelope for you to review at your convenience later.  YOU SHOULD EXPECT: Some feelings of bloating in the abdomen. Passage of more gas than usual.  Walking can help get rid of the air that was put into your GI tract during the procedure and reduce the bloating. If you had a lower endoscopy (such as a colonoscopy or flexible sigmoidoscopy) you may notice spotting of blood in your stool or on the toilet paper. If you underwent a bowel prep for your procedure, then you may not have a normal bowel movement for a few days.  DIET: Your first meal following the procedure should be a light meal and then it is ok to progress to your normal diet.  A half-sandwich or bowl of soup is an example of a good first meal.  Heavy or fried foods are harder to digest and may make you feel nauseous or bloated.  Likewise meals heavy in dairy and vegetables can cause extra gas to form and this can also increase the bloating.  Drink plenty of fluids but you should avoid alcoholic beverages for 24 hours.  ACTIVITY: Your care partner should take you home directly after the procedure.  You should plan to take it easy, moving slowly for the rest of the day.  You can resume normal activity the day after the procedure however you should NOT DRIVE or use heavy machinery for 24 hours (because of the sedation medicines used during the test).    SYMPTOMS TO REPORT IMMEDIATELY: A gastroenterologist can be reached at any hour.  During normal business hours, 8:30 AM to 5:00 PM Monday through Friday,  call [phone].  After hours and on weekends, please call the GI answering service at [phone] who will take a message and have the physician on call contact you.   Following lower endoscopy (colonoscopy or flexible sigmoidoscopy):  Excessive amounts of blood in the stool  Significant tenderness or worsening of abdominal pains  Swelling of the abdomen that is new, acute  Fever of 100F or higher    FOLLOW UP: If any biopsies were taken you will be contacted by phone or by letter within the next 1-3 weeks.  Call your gastroenterologist if you have not heard about the biopsies in 3 weeks.  Our staff will call the home number listed on your records the next business day following your procedure to check on you and address any questions or concerns that you may have at that time regarding the information given to you following your procedure. This is a courtesy call and so if there is no answer at the home number and we have not heard from you through the emergency physician on call, we will assume that you have returned to your regular daily activities without incident.  SIGNATURES/CONFIDENTIALITY: You and/or your care partner have signed paperwork which will be entered into your electronic medical record.  These signatures attest to the fact that that the information above on your After Visit Summary has been reviewed and is understood.  Full responsibility of the confidentiality   of this discharge information lies with you and/or your care-partner.    INFORMATION ON POLYPS & HEMORRHOIDS GIVEN TO YOU TODAY   MAY TAKE METAMUCIL OR CITRACEL DAILY AS DIRECTED WITH PLENTY OF WATER FOR CONSTIPATION

## 2012-07-29 NOTE — Op Note (Signed)
[facility] [address] [place], [address]   COLONOSCOPY PROCEDURE REPORT  PATIENT: Alyssa Butler, Alyssa Butler  MR#: [id number] BIRTHDATE: Jul 20, 1970 , 42  yrs. old GENDER: Female ENDOSCOPIST: Rachael Fee, MD REFERRED XB:JYNWG Maple Hudson, M.D. PROCEDURE DATE:  07/29/2012 PROCEDURE:   Colonoscopy with snare polypectomy ASA CLASS:   Class II INDICATIONS:chronic constipation, minor rectal bleeding. MEDICATIONS: Fentanyl 50 mcg IV, Versed 6 mg IV, and These medications were titrated to patient response per physician's verbal order  DESCRIPTION OF PROCEDURE:   After the risks benefits and alternatives of the procedure were thoroughly explained, informed consent was obtained.  A digital rectal exam revealed no abnormalities of the rectum.   The LB [id number]  endoscope was introduced through the anus and advanced to the cecum, which was identified by both the appendix and ileocecal valve. No adverse events experienced.   The quality of the prep was good.  The instrument was then slowly withdrawn as the colon was fully examined.   COLON FINDINGS: One polyp was found, removed and sent to pathology. This was 3mm across, sessile, located in sigmoid segment, removed with cold snare.  There were small external hemorrhoids.  The examination was otherwise normal.  Retroflexed views revealed no abnormalities. The time to cecum=1 minutes 30 seconds.  Withdrawal time=6 minutes 55 seconds.  The scope was withdrawn and the procedure completed. COMPLICATIONS: There were no complications.  ENDOSCOPIC IMPRESSION: One polyp was found, removed and sent to pathology. There were small external hemorrhoids. The examination was otherwise normal.  RECOMMENDATIONS: 1. If the polyp(s) removed today are proven to be adenomatous (pre-cancerous) polyps, you will need a repeat colonoscopy in 5 years.  Otherwise you should continue to follow colorectal cancer screening guidelines for  "routine risk" patients with colonoscopy in 10 years.  You will receive a letter within 1-2 weeks with the results of your biopsy as well as final recommendations.  Please call my office if you have not received a letter after 3 weeks. 2. Please start the once daily fiber supplement I recommended in the office last month, this will likely help your constipation.   eSigned:  Rachael Fee, MD 07/29/2012 3:08 PM

## 2012-07-30 ENCOUNTER — Telehealth: Payer: Self-pay | Admitting: *Deleted

## 2012-07-30 NOTE — Telephone Encounter (Signed)
  Follow up Call-  Call back number 07/29/2012  Post procedure Call Back phone  # [phone]  Permission to leave phone message Yes     Patient questions:  Message left for patient to call if necessary.

## 2012-08-10 ENCOUNTER — Encounter: Payer: Self-pay | Admitting: Gastroenterology

## 2012-08-12 NOTE — Progress Notes (Signed)
TC reviewed colonoscopy report, one polyp, reviewed if continues to see blood in stool will followup with GI or call.

## 2012-11-19 ENCOUNTER — Other Ambulatory Visit: Payer: Self-pay

## 2013-02-04 ENCOUNTER — Other Ambulatory Visit: Payer: Self-pay | Admitting: Women's Health

## 2013-02-04 DIAGNOSIS — Z1231 Encounter for screening mammogram for malignant neoplasm of breast: Secondary | ICD-10-CM

## 2013-02-09 ENCOUNTER — Encounter: Payer: Self-pay | Admitting: Women's Health

## 2013-02-09 ENCOUNTER — Other Ambulatory Visit: Payer: Self-pay | Admitting: Women's Health

## 2013-02-09 ENCOUNTER — Ambulatory Visit (INDEPENDENT_AMBULATORY_CARE_PROVIDER_SITE_OTHER): Payer: BC Managed Care – PPO | Admitting: Women's Health

## 2013-02-09 ENCOUNTER — Ambulatory Visit (INDEPENDENT_AMBULATORY_CARE_PROVIDER_SITE_OTHER): Payer: BC Managed Care – PPO

## 2013-02-09 DIAGNOSIS — R102 Pelvic and perineal pain: Secondary | ICD-10-CM

## 2013-02-09 DIAGNOSIS — D259 Leiomyoma of uterus, unspecified: Secondary | ICD-10-CM

## 2013-02-09 DIAGNOSIS — D251 Intramural leiomyoma of uterus: Secondary | ICD-10-CM

## 2013-02-09 DIAGNOSIS — N920 Excessive and frequent menstruation with regular cycle: Secondary | ICD-10-CM

## 2013-02-09 DIAGNOSIS — R9389 Abnormal findings on diagnostic imaging of other specified body structures: Secondary | ICD-10-CM

## 2013-02-09 DIAGNOSIS — R109 Unspecified abdominal pain: Secondary | ICD-10-CM

## 2013-02-09 DIAGNOSIS — K625 Hemorrhage of anus and rectum: Secondary | ICD-10-CM

## 2013-02-09 DIAGNOSIS — N949 Unspecified condition associated with female genital organs and menstrual cycle: Secondary | ICD-10-CM

## 2013-02-09 DIAGNOSIS — N898 Other specified noninflammatory disorders of vagina: Secondary | ICD-10-CM

## 2013-02-09 DIAGNOSIS — N8300 Follicular cyst of ovary, unspecified side: Secondary | ICD-10-CM

## 2013-02-09 DIAGNOSIS — B9689 Other specified bacterial agents as the cause of diseases classified elsewhere: Secondary | ICD-10-CM

## 2013-02-09 DIAGNOSIS — N760 Acute vaginitis: Secondary | ICD-10-CM

## 2013-02-09 DIAGNOSIS — A499 Bacterial infection, unspecified: Secondary | ICD-10-CM

## 2013-02-09 LAB — URINALYSIS W MICROSCOPIC + REFLEX CULTURE
BILIRUBIN URINE: NEGATIVE
CRYSTALS: NONE SEEN
Casts: NONE SEEN
Glucose, UA: NEGATIVE mg/dL
Ketones, ur: NEGATIVE mg/dL
Leukocytes, UA: NEGATIVE
NITRITE: NEGATIVE
PROTEIN: NEGATIVE mg/dL
SPECIFIC GRAVITY, URINE: 1.025 (ref 1.005–1.030)
Urobilinogen, UA: 0.2 mg/dL (ref 0.0–1.0)
WBC, UA: NONE SEEN WBC/hpf (ref <3)
pH: 5.5 (ref 5.0–8.0)

## 2013-02-09 LAB — WET PREP FOR TRICH, YEAST, CLUE
TRICH WET PREP: NONE SEEN
WBC WET PREP: NONE SEEN
Yeast Wet Prep HPF POC: NONE SEEN

## 2013-02-09 MED ORDER — METRONIDAZOLE 0.75 % VA GEL: VAGINAL | Status: DC

## 2013-02-09 NOTE — Progress Notes (Signed)
Patient ID: Alyssa Butler, female   DOB: 11/23/1970, 43 y.o.   MRN: [id number] Presents with low abdominal pressure/pain mostly on right side for past 2 weeks. 1 light cycle in November, elevated [facility] - 51 05/2012, has not been one year without cycle. Denies menopausal symptoms. History of 5 cm fundal fibroid/2011. Has had problems with chronic low abdominal pain for years and chronic constipation.  07/2012 colonoscopy benign polyp. Denies constipation at this time or urinary pain. Minimal discharge without itching or odor.  Exam: Affect flat. External genitalia within normal limits, speculum exam scant white discharge wet prep positive for amines, clues, and TNTC bacteria. Bimanual uterus enlarged, abdomen obese difficult to palpate size no CMT. UA small blood, 3-6 rbc's, few bacteria. Ultrasound: Enlarged uterus with fundal fibroid 8.6 x 9.4 x 5.6 cm. Prominent endometrial cavity vascular, 8.6 mm. Right ovary follicles 23 x 19 x 15 mm. Left ovarian follicle 25 x 16 x 22 mm. Negative cul-de-sac. Endometrium displaced by fibroid.  Large fundal fibroid Pelvic pressure Bacterial Vaginosis  Plan: MetroGel one applicator at bedtime x5, prescription, proper use, alcohol avoidance reviewed. Handout on fibroids and hysterectomy reviewed. Reviewed fibroids are benign, but could be causing pressure sensation. Will watch at this time. Urine culture pending. Instructed to call if any further bleeding.

## 2013-02-09 NOTE — Patient Instructions (Signed)
Fibroids Fibroids are lumps (tumors) that can occur any place in a woman's body. These lumps are not cancerous. Fibroids vary in size, weight, and where they grow. HOME CARE  Do not take aspirin.  Write down the number of pads or tampons you use during your period. Tell your doctor. This can help determine the best treatment for you. GET HELP RIGHT AWAY IF:  You have pain in your lower belly (abdomen) that is not helped with medicine.  You have cramps that are not helped with medicine.  You have more bleeding between or during your period.  You feel lightheaded or pass out (faint).  Your lower belly pain gets worse. MAKE SURE YOU:  Understand these instructions.  Will watch your condition.  Will get help right away if you are not doing well or get worse. Document Released: 02/02/2010 Document Revised: 03/25/2011 Document Reviewed: 02/02/2010 ExitCare Patient Information 2014 [employer].  

## 2013-02-10 LAB — URINE CULTURE
Colony Count: NO GROWTH
ORGANISM ID, BACTERIA: NO GROWTH

## 2013-02-11 ENCOUNTER — Ambulatory Visit (HOSPITAL_COMMUNITY)
Admission: RE | Admit: 2013-02-11 | Discharge: 2013-02-11 | Disposition: A | Discharge status: Home / Self Care | Payer: BC Managed Care – PPO | Source: Ambulatory Visit | Attending: Women's Health | Admitting: Women's Health

## 2013-02-11 DIAGNOSIS — Z1231 Encounter for screening mammogram for malignant neoplasm of breast: Secondary | ICD-10-CM

## 2013-02-20 ENCOUNTER — Encounter: Payer: Self-pay | Admitting: Women's Health

## 2013-02-22 ENCOUNTER — Telehealth: Payer: Self-pay

## 2013-02-22 NOTE — Telephone Encounter (Signed)
Mrs. Alyssa Butler wanted me to let here know if and when I start my menstrual cycle. I started on 02/20/13. It is VERY heavy, and I am clotting really bad. Moderate pain, in which I am taking Ibuprofen for that. Would I need to come back in to make sure the bacteria infection is cleared up, due to the fact that my cycle started during the last application.  I did advise her back by email that office visit not necessary for TOC with bacterial infection and that the medication usually treats it.  I told her if symptoms recur soon to let you know and you would probably retreat without requiring a visit. I also told her to let me know if she would like office visit today to address concerns regarding her cycle being so heavy.  Mostly just forwarding this to you so you have the info she said you wanted her to let you know about menses.

## 2013-02-22 NOTE — Telephone Encounter (Signed)
Emailed this response back to Patient.

## 2013-02-22 NOTE — Telephone Encounter (Signed)
Please call, has had an elevated FSH, but has not been amenorrhic for a year yet.  Tell her I hope this is her last or near last cycle. Continue the motrin. (She has a 9 cm fibroid!)

## 2013-06-04 ENCOUNTER — Encounter: Payer: Self-pay | Admitting: *Deleted

## 2013-06-15 ENCOUNTER — Ambulatory Visit: Payer: Managed Care, Other (non HMO) | Admitting: Nurse Practitioner

## 2013-07-04 ENCOUNTER — Emergency Department (HOSPITAL_COMMUNITY)
Admission: EM | Admit: 2013-07-04 | Discharge: 2013-07-05 | Disposition: A | Discharge status: Home / Self Care | Payer: BC Managed Care – PPO | Attending: Emergency Medicine | Admitting: Emergency Medicine

## 2013-07-04 ENCOUNTER — Emergency Department (HOSPITAL_COMMUNITY): Payer: BC Managed Care – PPO

## 2013-07-04 ENCOUNTER — Encounter (HOSPITAL_COMMUNITY): Payer: Self-pay | Admitting: Emergency Medicine

## 2013-07-04 DIAGNOSIS — Z8742 Personal history of other diseases of the female genital tract: Secondary | ICD-10-CM | POA: Insufficient documentation

## 2013-07-04 DIAGNOSIS — Z860100 Personal history of colon polyps, unspecified: Secondary | ICD-10-CM | POA: Insufficient documentation

## 2013-07-04 DIAGNOSIS — F17200 Nicotine dependence, unspecified, uncomplicated: Secondary | ICD-10-CM | POA: Insufficient documentation

## 2013-07-04 DIAGNOSIS — Z79899 Other long term (current) drug therapy: Secondary | ICD-10-CM | POA: Insufficient documentation

## 2013-07-04 DIAGNOSIS — B9789 Other viral agents as the cause of diseases classified elsewhere: Secondary | ICD-10-CM

## 2013-07-04 DIAGNOSIS — R5381 Other malaise: Secondary | ICD-10-CM | POA: Insufficient documentation

## 2013-07-04 DIAGNOSIS — R0602 Shortness of breath: Secondary | ICD-10-CM | POA: Insufficient documentation

## 2013-07-04 DIAGNOSIS — R5383 Other fatigue: Secondary | ICD-10-CM

## 2013-07-04 DIAGNOSIS — R0789 Other chest pain: Secondary | ICD-10-CM | POA: Insufficient documentation

## 2013-07-04 DIAGNOSIS — J069 Acute upper respiratory infection, unspecified: Secondary | ICD-10-CM | POA: Insufficient documentation

## 2013-07-04 DIAGNOSIS — Z872 Personal history of diseases of the skin and subcutaneous tissue: Secondary | ICD-10-CM | POA: Insufficient documentation

## 2013-07-04 NOTE — ED Notes (Signed)
Patient states she was out with friends dancing, became SOB and started having central chest pains. Patient states she has had cold like symptoms x2 weeks. Patient states she feels "congested" at this time, no really having chest pain.

## 2013-07-05 MED ORDER — ALBUTEROL SULFATE HFA 108 (90 BASE) MCG/ACT IN AERS
2.0000 | INHALATION_SPRAY | RESPIRATORY_TRACT | Status: DC | PRN
  Administered 2013-07-05: 2 via RESPIRATORY_TRACT
  Filled 2013-07-05: qty 6.7

## 2013-07-05 MED ORDER — PREDNISONE 20 MG PO TABS: 40.0000 mg | ORAL_TABLET | Freq: Every day | ORAL | Status: DC

## 2013-07-05 NOTE — ED Notes (Signed)
Pt remained 98-99% while ambulating.

## 2013-07-05 NOTE — ED Provider Notes (Signed)
CSN: [id number]     Arrival date & time 07/04/13  2140 History   First MD Initiated Contact with Patient 07/04/13 2315     Chief Complaint  Patient presents with  . URI    (Consider location/radiation/quality/duration/timing/severity/associated sxs/prior Treatment) HPI Comments: Patient is a 43 year old female with no significant past medical history who presents to the emergency department for shortness of breath. Patient states that she was dancing yesterday evening for her birthday when she felt short of breath with exertion. She states that this resolves with rest. Symptoms preceded by nasal congestion, rhinorrhea, and cough x2 weeks. She states that she has been experiencing some diffuse chest tightness, but, unlike triage note, denies central, focal chest pain. Patient states that her cough has been productive of yellow phlegm. She denies associated fever, syncope or near syncope, hemoptysis, nausea or vomiting, numbness/paresthesias, extremity weakness.  Patient is a 43 y.o. female presenting with URI. The history is provided by the patient. No language interpreter was used.  URI Presenting symptoms: congestion, cough, fatigue and rhinorrhea   Presenting symptoms: no fever     Past Medical History  Diagnosis Date  . Fibroid   . Abdominal pain   . Heartburn   . GERD (gastroesophageal reflux disease)   . Constipation   . Rectal bleed   . Vision changes   . Muscle pain   . Skin rash   . 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 Tubular adenoma of colon 07/29/2012   Past Surgical History  Procedure Laterality Date  . Wisdom tooth extraction    . Therapeutic abortion     Family History  Problem Relation Age of Onset  . Heart attack Maternal Uncle   . Kidney failure Maternal Grandmother   . Heart attack Maternal Grandfather    History  Substance Use Topics  . Smoking status: Current Every Day Smoker -- 0.25 packs/day for 15 years    Types: Cigars  .  Smokeless tobacco: Never Used  . Alcohol Use: 9.9 oz/week    3 Drinks containing 0.5 oz of alcohol, 14 Cans of beer per week   OB History   Grav Para Term Preterm Abortions TAB SAB Ect Mult Living   1    1 1           Review of Systems  Constitutional: Positive for fatigue. Negative for fever.  HENT: Positive for congestion and rhinorrhea.   Respiratory: Positive for cough, chest tightness and shortness of breath.   Cardiovascular: Negative for chest pain.  Gastrointestinal: Negative for nausea and vomiting.  Neurological: Negative for syncope and light-headedness.  All other systems reviewed and are negative.    Allergies  Review of patient's allergies indicates no known allergies.  Home Medications   Prior to Admission medications   Medication Sig Start Date End Date Taking? Authorizing Provider  metroNIDAZOLE (METROGEL VAGINAL) 0.75 % vaginal gel 1 applicator per vagina at HS x 5 02/09/13   Huel Cote, NP  predniSONE (DELTASONE) 20 MG tablet Take 2 tablets (40 mg total) by mouth daily. 07/05/13   Antonietta Breach, PA-C   BP 150/83  Pulse 73  Temp(Src) 98.3 F (36.8 C) (Oral)  Resp 18  Ht 5' 6.5" (1.689 m)  Wt 200 lb (90.719 kg)  BMI 31.80 kg/m2  SpO2 99%  Physical Exam  Nursing note and vitals reviewed. Constitutional: She is oriented to person, place, and time. She appears well-developed and well-nourished. No distress.  Nontoxic/nonseptic appearing  HENT:  Head: Normocephalic and atraumatic.  Mouth/Throat: Oropharynx is clear and moist. No oropharyngeal exudate.  Eyes: Conjunctivae and EOM are normal. No scleral icterus.  Neck: Normal range of motion. Neck supple.  Cardiovascular: Normal rate, regular rhythm and normal heart sounds.   Pulmonary/Chest: Effort normal and breath sounds normal. No respiratory distress. She has no wheezes. She has no rales.  No tachypnea, dyspnea, or accessory muscle use. Chest expansion symmetrical. No wheezes.  Musculoskeletal:  Normal range of motion.  Neurological: She is alert and oriented to person, place, and time.  GCS 15. Speech is goal oriented. Patient moves extremities without ataxia.  Skin: Skin is warm and dry. No rash noted. She is not diaphoretic. No erythema. No pallor.  Psychiatric: She has a normal mood and affect. Her behavior is normal.    ED Course  Procedures (including critical care time) Labs Review Labs Reviewed - No data to display  Imaging Review Dg Chest 2 View  07/05/2013   CLINICAL DATA:  Shortness of breath. Chest pain. Coughing congestion for 2 weeks.  EXAM: CHEST  2 VIEW  COMPARISON:  09/09/2008  FINDINGS: The heart size and mediastinal contours are within normal limits. Both lungs are clear. The visualized skeletal structures are unremarkable.  IMPRESSION: No active cardiopulmonary disease.   Electronically Signed   By: Lucienne Capers M.D.   On: 07/05/2013 00:23     EKG Interpretation None      MDM   Final diagnoses:  Viral URI with cough    Pt CXR negative for acute infiltrate. Patients symptoms are consistent with URI, likely viral etiology and potentially progressing to viral bronchitis. She ambulates in the ED without hypoxia. Doubt PE given lack of hemoptysis, risk factors, tachycardia, tachypnea, dyspnea, or hypoxia today. Discussed that antibiotics are not indicated for viral infections. Pt will be discharged with symptomatic treatment. Verbalizes understanding and is agreeable with plan. Pt is hemodynamically stable and in NAD prior to discharge.   Filed Vitals:   07/04/13 2149 07/05/13 0047  BP: 148/84 150/83  Pulse: 89 73  Temp: 98.7 F (37.1 C) 98.3 F (36.8 C)  TempSrc: Oral Oral  Resp: 18 18  Height: 5' 6.5" (1.689 m)   Weight: 200 lb (90.719 kg)   SpO2: 100% 99%       Antonietta Breach, PA-C 07/05/13 2247

## 2013-07-05 NOTE — Discharge Instructions (Signed)
Cough, Adult   A cough is a reflex that helps clear your throat and airways. It can help heal the body or may be a reaction to an irritated airway. A cough may only last 2 or 3 weeks (acute) or may last more than 8 weeks (chronic).   CAUSES  Acute cough:   Viral or bacterial infections.  Chronic cough:   Infections.   Allergies.   Asthma.   Post-nasal drip.   Smoking.   Heartburn or acid reflux.   Some medicines.   Chronic lung problems (COPD).   Cancer.  SYMPTOMS    Cough.   Fever.   Chest pain.   Increased breathing rate.   High-pitched whistling sound when breathing (wheezing).   Colored mucus that you cough up (sputum).  TREATMENT    A bacterial cough may be treated with antibiotic medicine.   A viral cough must run its course and will not respond to antibiotics.   Your caregiver may recommend other treatments if you have a chronic cough.  HOME CARE INSTRUCTIONS    Only take over-the-counter or prescription medicines for pain, discomfort, or fever as directed by your caregiver. Use cough suppressants only as directed by your caregiver.   Use a cold steam vaporizer or humidifier in your bedroom or home to help loosen secretions.   Sleep in a semi-upright position if your cough is worse at night.   Rest as needed.   Stop smoking if you smoke.  SEEK IMMEDIATE MEDICAL CARE IF:    You have pus in your sputum.   Your cough starts to worsen.   You cannot control your cough with suppressants and are losing sleep.   You begin coughing up blood.   You have difficulty breathing.   You develop pain which is getting worse or is uncontrolled with medicine.   You have a fever.  MAKE SURE YOU:    Understand these instructions.   Will watch your condition.   Will get help right away if you are not doing well or get worse.  Document Released: 06/29/2010 Document Revised: 03/25/2011 Document Reviewed: 06/29/2010  ExitCare Patient Information 2015 [employer]. This information is not intended  to replace advice given to you by your health care provider. Make sure you discuss any questions you have with your health care provider.    Bronchitis  Bronchitis is inflammation of the airways that extend from the windpipe into the lungs (bronchi). The inflammation often causes mucus to develop, which leads to a cough. If the inflammation becomes severe, it may cause shortness of breath.  CAUSES   Bronchitis may be caused by:    Viral infections.    Bacteria.    Cigarette smoke.    Allergens, pollutants, and other irritants.   SIGNS AND SYMPTOMS   The most common symptom of bronchitis is a frequent cough that produces mucus. Other symptoms include:   Fever.    Body aches.    Chest congestion.    Chills.    Shortness of breath.    Sore throat.   DIAGNOSIS   Bronchitis is usually diagnosed through a medical history and physical exam. Tests, such as chest X-rays, are sometimes done to rule out other conditions.   TREATMENT   You may need to avoid contact with whatever caused the problem (smoking, for example). Medicines are sometimes needed. These may include:   Antibiotics. These may be prescribed if the condition is caused by bacteria.   Cough suppressants. These may   be prescribed for relief of cough symptoms.    Inhaled medicines. These may be prescribed to help open your airways and make it easier for you to breathe.    Steroid medicines. These may be prescribed for those with recurrent (chronic) bronchitis.  HOME CARE INSTRUCTIONS   Get plenty of rest.    Drink enough fluids to keep your urine clear or pale yellow (unless you have a medical condition that requires fluid restriction). Increasing fluids may help thin your secretions and will prevent dehydration.    Only take over-the-counter or prescription medicines as directed by your health care provider.   Only take antibiotics as directed. Make sure you finish them even if you start to feel better.   Avoid secondhand smoke,  irritating chemicals, and strong fumes. These will make bronchitis worse. If you are a smoker, quit smoking. Consider using nicotine gum or skin patches to help control withdrawal symptoms. Quitting smoking will help your lungs heal faster.    Put a cool-mist humidifier in your bedroom at night to moisten the air. This may help loosen mucus. Change the water in the humidifier daily. You can also run the hot water in your shower and sit in the bathroom with the door closed for 5-10 minutes.    Follow up with your health care provider as directed.    Wash your hands frequently to avoid catching bronchitis again or spreading an infection to others.   SEEK MEDICAL CARE IF:  Your symptoms do not improve after 1 week of treatment.   SEEK IMMEDIATE MEDICAL CARE IF:   Your fever increases.   You have chills.    You have chest pain.    You have worsening shortness of breath.    You have bloody sputum.   You faint.   You have lightheadedness.   You have a severe headache.    You vomit repeatedly.  MAKE SURE YOU:    Understand these instructions.   Will watch your condition.   Will get help right away if you are not doing well or get worse.  Document Released: 12/31/2004 Document Revised: 10/21/2012 Document Reviewed: 08/25/2012  Exit[employer] Patient Information 2015 [employer]. This information is not intended to replace advice given to you by your health care provider. Make sure you discuss any questions you have with your health care provider.

## 2013-07-06 NOTE — ED Provider Notes (Signed)
Medical screening examination/treatment/procedure(s) were performed by non-physician practitioner and as supervising physician I was immediately available for consultation/collaboration.   EKG Interpretation None        Julianne Rice, MD 07/06/13 [phone]

## 2013-08-08 ENCOUNTER — Emergency Department (HOSPITAL_COMMUNITY): Payer: BC Managed Care – PPO

## 2013-08-08 ENCOUNTER — Encounter (HOSPITAL_COMMUNITY): Payer: Self-pay | Admitting: Emergency Medicine

## 2013-08-08 ENCOUNTER — Emergency Department (HOSPITAL_COMMUNITY)
Admission: EM | Admit: 2013-08-08 | Discharge: 2013-08-09 | Disposition: A | Discharge status: Home / Self Care | Payer: BC Managed Care – PPO | Attending: Emergency Medicine | Admitting: Emergency Medicine

## 2013-08-08 DIAGNOSIS — IMO0001 Reserved for inherently not codable concepts without codable children: Secondary | ICD-10-CM | POA: Insufficient documentation

## 2013-08-08 DIAGNOSIS — M79609 Pain in unspecified limb: Secondary | ICD-10-CM | POA: Insufficient documentation

## 2013-08-08 DIAGNOSIS — R51 Headache: Secondary | ICD-10-CM | POA: Insufficient documentation

## 2013-08-08 DIAGNOSIS — R209 Unspecified disturbances of skin sensation: Secondary | ICD-10-CM | POA: Insufficient documentation

## 2013-08-08 DIAGNOSIS — M25519 Pain in unspecified shoulder: Secondary | ICD-10-CM | POA: Insufficient documentation

## 2013-08-08 DIAGNOSIS — M791 Myalgia, unspecified site: Secondary | ICD-10-CM

## 2013-08-08 DIAGNOSIS — Z8742 Personal history of other diseases of the female genital tract: Secondary | ICD-10-CM | POA: Insufficient documentation

## 2013-08-08 DIAGNOSIS — Z8669 Personal history of other diseases of the nervous system and sense organs: Secondary | ICD-10-CM | POA: Insufficient documentation

## 2013-08-08 DIAGNOSIS — Z8719 Personal history of other diseases of the digestive system: Secondary | ICD-10-CM | POA: Insufficient documentation

## 2013-08-08 DIAGNOSIS — F17200 Nicotine dependence, unspecified, uncomplicated: Secondary | ICD-10-CM | POA: Insufficient documentation

## 2013-08-08 MED ORDER — METOCLOPRAMIDE HCL 5 MG/ML IJ SOLN
10.0000 mg | INTRAMUSCULAR | Status: AC
  Administered 2013-08-08: 10 mg via INTRAVENOUS
  Filled 2013-08-08: qty 2

## 2013-08-08 MED ORDER — KETOROLAC TROMETHAMINE 30 MG/ML IJ SOLN
30.0000 mg | Freq: Once | INTRAMUSCULAR | Status: AC
  Administered 2013-08-08: 30 mg via INTRAVENOUS
  Filled 2013-08-08: qty 1

## 2013-08-08 MED ORDER — CYCLOBENZAPRINE HCL 10 MG PO TABS: 10.0000 mg | ORAL_TABLET | Freq: Two times a day (BID) | ORAL | Status: DC | PRN

## 2013-08-08 MED ORDER — DEXAMETHASONE SODIUM PHOSPHATE 10 MG/ML IJ SOLN
10.0000 mg | Freq: Once | INTRAMUSCULAR | Status: AC
  Administered 2013-08-08: 10 mg via INTRAVENOUS
  Filled 2013-08-08: qty 1

## 2013-08-08 MED ORDER — NAPROXEN 500 MG PO TABS: 500.0000 mg | ORAL_TABLET | Freq: Two times a day (BID) | ORAL | Status: DC

## 2013-08-08 NOTE — ED Provider Notes (Signed)
CSN: [id number]     Arrival date & time 08/08/13  1919 History   This chart was scribed for Antonietta Breach, PA-C working with Tanna Furry, MD by Randa Evens, ED Scribe. This patient was seen in room WTR5/WTR5 and the patient's care was started at 8:37 PM.     Chief Complaint  Patient presents with  . Facial Pain  . Shoulder Pain   Patient is a 43 y.o. female presenting with shoulder pain. The history is provided by the patient. No language interpreter was used.  Shoulder Pain Associated symptoms include headaches.   HPI Comments: Alyssa Butler is a 43 y.o. female who presents to the Emergency Department complaining of right sided facial pain onset 1 day prior. She states the pain radiates all the way down her right side. She describes the pain as aching. She states she has associated headache, right shoulder pain, right leg pain, and paresthesias to her R side. She states he hasn't taken any medication prior to arrival for the pain. She denies hearing changes, facial drooping, visual disturbance, trouble swallowing, tinnitus, extremity weakness, head trauma, or LOC. PCP - Dr. Elon Alas   Past Medical History  Diagnosis Date  . Fibroid   . Abdominal pain   . Heartburn   . GERD (gastroesophageal reflux disease)   . Constipation   . Rectal bleed   . Vision changes   . Muscle pain   . Skin rash   . 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 Tubular adenoma of colon 07/29/2012   Past Surgical History  Procedure Laterality Date  . Wisdom tooth extraction    . Therapeutic abortion     Family History  Problem Relation Age of Onset  . Heart attack Maternal Uncle   . Kidney failure Maternal Grandmother   . Heart attack Maternal Grandfather    History  Substance Use Topics  . Smoking status: Current Every Day Smoker -- 0.25 packs/day for 15 years    Types: Cigars  . Smokeless tobacco: Never Used  . Alcohol Use: 9.9 oz/week    3 Drinks containing 0.5 oz of  alcohol, 14 Cans of beer per week   OB History   Grav Para Term Preterm Abortions TAB SAB Ect Mult Living   1    1 1          Review of Systems  HENT: Negative for tinnitus and trouble swallowing.   Eyes: Negative for visual disturbance.  Musculoskeletal: Positive for arthralgias.  Neurological: Positive for numbness and headaches. Negative for syncope.    Allergies  Review of patient's allergies indicates no known allergies.  Home Medications   Prior to Admission medications   Medication Sig Start Date End Date Taking? Authorizing Provider  Aspirin-Acetaminophen-Caffeine (GOODY HEADACHE PO) Take 1 each by mouth once as needed (headache.).   Yes Historical Provider, MD  ibuprofen (ADVIL,MOTRIN) 200 MG tablet Take 400 mg by mouth every 6 (six) hours as needed for moderate pain.   Yes Historical Provider, MD  Multiple Vitamin (MULTIVITAMIN WITH MINERALS) TABS tablet Take 1 tablet by mouth every other day.   Yes Historical Provider, MD  cyclobenzaprine (FLEXERIL) 10 MG tablet Take 1 tablet (10 mg total) by mouth 2 (two) times daily as needed for muscle spasms. 08/08/13   Antonietta Breach, PA-C  naproxen (NAPROSYN) 500 MG tablet Take 1 tablet (500 mg total) by mouth 2 (two) times daily. 08/08/13   Antonietta Breach, PA-C   Triage Vitals: BP 132/73  Pulse 96  Temp(Src) 98.2 F (36.8 C) (Oral)  Resp 16  SpO2 98%  Physical Exam  Nursing note and vitals reviewed. Constitutional: She is oriented to person, place, and time. She appears well-developed and well-nourished. No distress.  Nontoxic/nonseptic appearing  HENT:  Head: Normocephalic and atraumatic.  Mouth/Throat: Oropharynx is clear and moist. No oropharyngeal exudate.  Eyes: Conjunctivae and EOM are normal. Pupils are equal, round, and reactive to light.  Pupils equal round and reactive to direct and consensual light. EOMs normal without nystagmus  Neck: Normal range of motion. Neck supple. No tracheal deviation present.  No nuchal  rigidity or meningismus. Mild tenderness to right cervical paraspinal muscles. No bony deformities, step-off, or crepitus.  Cardiovascular: Normal rate, regular rhythm, normal heart sounds and intact distal pulses.   No carotid bruits b/l.  Pulmonary/Chest: Effort normal and breath sounds normal. No respiratory distress. She has no wheezes. She has no rales.  Abdominal: Soft. She exhibits no distension. There is no tenderness.  Musculoskeletal: Normal range of motion.  Neurological: She is alert and oriented to person, place, and time. No cranial nerve deficit. She exhibits normal muscle tone. Coordination normal.  GCS 15. Speech is goal oriented. No cranial nerve deficits appreciated; symmetric eyebrow rate, no facial drooping, equal tongue protrusion, tongue midline. Equal grip strength bilaterally with normal shoulder shrugging. Normal strength against resistance and sensation to light touch. Patient moves extremities without ataxia and ambulates with normal gait.  Skin: Skin is warm and dry. No rash noted. No erythema. No pallor.  Psychiatric: She has a normal mood and affect. Her behavior is normal.    ED Course  Procedures (including critical care time) DIAGNOSTIC STUDIES: Oxygen Saturation is 98% on RA, normal by my interpretation.    COORDINATION OF CARE: 9:05 PM-Discussed treatment plan which includes IV medications and CT of head with pt at bedside and pt agreed to plan.   Labs Review Labs Reviewed - No data to display  Imaging Review Ct Head Wo Contrast  08/08/2013   CLINICAL DATA:  Right-sided headache and facial pain.  EXAM: CT HEAD WITHOUT CONTRAST  TECHNIQUE: Contiguous axial images were obtained from the base of the skull through the vertex without intravenous contrast.  COMPARISON:  None.  FINDINGS: No mass lesion. No midline shift. No acute hemorrhage or hematoma. No extra-axial fluid collections. No evidence of acute infarction. Brain parenchyma is normal. Osseous  structures are normal.  IMPRESSION: Normal exam.   Electronically Signed   By: Rozetta Nunnery M.D.   On: 08/08/2013 22:42     EKG Interpretation None      MDM   Final diagnoses:  Myalgia    43 year old female presents for right-sided myalgias. No known trauma or injury. No recent falls. Patient denies any neurologic symptoms concerning for stroke. CT imaging today is unremarkable. Patient's neurologic exam is nonfocal. Patient treated in ED with Reglan, Decadron, and Toradol. Pain improved from 6/10 to 0/10 with this regimen. Myalgias today are of unknown cause, but doubt emergent process given reassuring physical exam and imaging. Will manage as outpatient with Flexeril and naproxen. Primary care followup advised and return precautions provided. Patient agreeable to plan with no unaddressed concerns. Patient seen also by Dr. Tanna Furry who is in agreement with this workup, assessment, management plan, and patient's stability for discharge.  I personally performed the services described in this documentation, which was scribed in my presence. The recorded information has been reviewed and is accurate.   Filed Vitals:  08/08/13 1948 08/09/13 0018  BP: 132/73 125/73  Pulse: 96 72  Temp: 98.2 F (36.8 C) 98.2 F (36.8 C)  TempSrc: Oral Oral  Resp: 16 16  SpO2: 98% 100%        Antonietta Breach, PA-C 08/09/13 0036

## 2013-08-08 NOTE — Discharge Instructions (Signed)

## 2013-08-08 NOTE — ED Notes (Signed)
Pt states she has R sided facial/eye pain which started yesterday. Pt states now pain is in R side of head, neck and R shoulder. Pt also states "I have discomfort on my entire R side of body." Pt states she also feels weak and tired. Pt denies nausea. States her pain is worsened with mvmt. Pt alert, no acute distress. Skin warm and dry.

## 2013-08-17 NOTE — ED Provider Notes (Signed)
Medical screening examination/treatment/procedure(s) were performed by non-physician practitioner and as supervising physician I was immediately available for consultation/collaboration.   EKG Interpretation None        Tanna Furry, MD 08/17/13 1520

## 2013-11-11 ENCOUNTER — Encounter: Payer: Self-pay | Admitting: Women's Health

## 2013-11-11 ENCOUNTER — Ambulatory Visit (INDEPENDENT_AMBULATORY_CARE_PROVIDER_SITE_OTHER): Payer: BC Managed Care – PPO | Admitting: Women's Health

## 2013-11-11 ENCOUNTER — Other Ambulatory Visit: Payer: Self-pay | Admitting: Women's Health

## 2013-11-11 ENCOUNTER — Ambulatory Visit (INDEPENDENT_AMBULATORY_CARE_PROVIDER_SITE_OTHER): Payer: BC Managed Care – PPO

## 2013-11-11 VITALS — BP 142/84 | Ht 67.0 in | Wt 206.0 lb

## 2013-11-11 DIAGNOSIS — N852 Hypertrophy of uterus: Secondary | ICD-10-CM

## 2013-11-11 DIAGNOSIS — R140 Abdominal distension (gaseous): Secondary | ICD-10-CM

## 2013-11-11 DIAGNOSIS — N8300 Follicular cyst of ovary, unspecified side: Secondary | ICD-10-CM

## 2013-11-11 DIAGNOSIS — B9689 Other specified bacterial agents as the cause of diseases classified elsewhere: Secondary | ICD-10-CM

## 2013-11-11 DIAGNOSIS — D251 Intramural leiomyoma of uterus: Secondary | ICD-10-CM

## 2013-11-11 DIAGNOSIS — F329 Major depressive disorder, single episode, unspecified: Secondary | ICD-10-CM

## 2013-11-11 DIAGNOSIS — N912 Amenorrhea, unspecified: Secondary | ICD-10-CM

## 2013-11-11 DIAGNOSIS — N7011 Chronic salpingitis: Secondary | ICD-10-CM

## 2013-11-11 DIAGNOSIS — N760 Acute vaginitis: Secondary | ICD-10-CM

## 2013-11-11 DIAGNOSIS — A499 Bacterial infection, unspecified: Secondary | ICD-10-CM

## 2013-11-11 DIAGNOSIS — F32A Depression, unspecified: Secondary | ICD-10-CM

## 2013-11-11 DIAGNOSIS — D252 Subserosal leiomyoma of uterus: Secondary | ICD-10-CM

## 2013-11-11 DIAGNOSIS — D259 Leiomyoma of uterus, unspecified: Secondary | ICD-10-CM | POA: Insufficient documentation

## 2013-11-11 DIAGNOSIS — N898 Other specified noninflammatory disorders of vagina: Secondary | ICD-10-CM

## 2013-11-11 LAB — WET PREP FOR TRICH, YEAST, CLUE
TRICH WET PREP: NONE SEEN
WBC, Wet Prep HPF POC: NONE SEEN
YEAST WET PREP: NONE SEEN

## 2013-11-11 MED ORDER — FLUOXETINE HCL 10 MG PO TABS: ORAL_TABLET | ORAL | Status: DC

## 2013-11-11 MED ORDER — METRONIDAZOLE 0.75 % VA GEL: VAGINAL | Status: DC

## 2013-11-11 NOTE — Progress Notes (Signed)
Presents with 2 week history of abdominal bloating and pressure. Denies constipation, diarrhea or blood in stools.  01/05/2010 T/V [place] fundal fibroid measuring 5.1 x 7.5 x 6.7 cm. Jan 2015 Fibroid 8.6 x 9.4 x 5.6cm. Requests [place] to monitor fibroid. Reports one week of clear vaginal discharge with odor. Denies urinary symptoms. LMP 03/2013.  Condom use. Hot flashes nightly, but not during day. Reports several week hx of crying episodes daily, lack of motivation, and poor sleep. Work schedule varies, starting at 3p or 6:30a. Drinks 2  25oz cans of beer daily.   Exam: Flat affect and tearful at times. External genitalia WNLs. Vaginal wall with atrophy, scant discharge,  no erythema. Cervix visualized. Bimanual no CMT or adnexal fullness. Uterus palpable to umbilicus region. Wet prep: Positive amine, moderate clue cells, TNTC bacteria.  [place]: Transvaginal and transabdominal enlarged fibroid uterus fibroid [phone] cm, 3123 mm, displaced endometrium. Right ovary normal. Left ovary 2 follicles less than 13 mm, tubular cystic mass thick-walled negative CFD 26 x 13 x 21 mm. Negative cul-de-sac. Ultrasound 02/11/2013 previous tubular mass in the left adnexa not seen.  Fibroid  Bacterial Vaginosis Perimenopausal Depression  Plan:   MetroGel vaginal cream 4.59%  1 applicator at bedtime 5. Alcohol precautions reviewed. [place] pending. Prozac 10mg  PO-take 1 tablet for 1-2 weeks and then 2 tabs daily.  Call if no symptom improvement. Resources for counseling encouraged, phone numbers given and referral to [facility]. Denies need for counseling at this time. Again hysterectomy for fibroids reviewed, is having increased pressure and would like to have removed possibly in February, will call back if decides to proceed to schedule surgical consult with Dr. Phineas Real.

## 2013-11-12 LAB — FOLLICLE STIMULATING HORMONE: FSH: 40.2 m[IU]/mL

## 2013-11-15 ENCOUNTER — Encounter: Payer: Self-pay | Admitting: Women's Health

## 2013-11-30 ENCOUNTER — Ambulatory Visit: Payer: BC Managed Care – PPO | Admitting: Women's Health

## 2013-12-01 ENCOUNTER — Encounter: Payer: Self-pay | Admitting: Women's Health

## 2013-12-01 ENCOUNTER — Ambulatory Visit (INDEPENDENT_AMBULATORY_CARE_PROVIDER_SITE_OTHER): Payer: BC Managed Care – PPO | Admitting: Women's Health

## 2013-12-01 VITALS — BP 134/80 | Ht 66.0 in | Wt 206.0 lb

## 2013-12-01 DIAGNOSIS — B3731 Acute candidiasis of vulva and vagina: Secondary | ICD-10-CM

## 2013-12-01 DIAGNOSIS — A499 Bacterial infection, unspecified: Secondary | ICD-10-CM

## 2013-12-01 DIAGNOSIS — B9689 Other specified bacterial agents as the cause of diseases classified elsewhere: Secondary | ICD-10-CM

## 2013-12-01 DIAGNOSIS — N898 Other specified noninflammatory disorders of vagina: Secondary | ICD-10-CM

## 2013-12-01 DIAGNOSIS — N760 Acute vaginitis: Secondary | ICD-10-CM

## 2013-12-01 DIAGNOSIS — B373 Candidiasis of vulva and vagina: Secondary | ICD-10-CM

## 2013-12-01 LAB — WET PREP FOR TRICH, YEAST, CLUE: Trich, Wet Prep: NONE SEEN

## 2013-12-01 MED ORDER — METRONIDAZOLE 500 MG PO TABS: 500.0000 mg | ORAL_TABLET | Freq: Two times a day (BID) | ORAL | Status: DC

## 2013-12-01 MED ORDER — TERCONAZOLE 0.8 % VA CREA: 1.0000 | TOPICAL_CREAM | Freq: Every day | VAGINAL | Status: DC

## 2013-12-01 NOTE — Patient Instructions (Signed)

## 2013-12-01 NOTE — Progress Notes (Signed)
Patient ID: Alyssa Butler, female   DOB: [birth year], 43 y.o.   MRN: [id number] Presents with complaint of increased vaginal discharge with itching. Had completed MetroGel 1 week ago. Amenorrheic with elevated FSH. Also states is having mild chest pain without SOB. After discussion most likely indigestion/heartburn. Denies abdominal pain, fever, nausea, arm or jaw pain or urinary symptoms. smoker  Exam: Appears well, no distress,calm. Heart regular rate and rhythm, lungs clear throughout, color good, blood pressure 134/80 Abdomen obese, external genitalia within normal limits, speculum exam moderate amount of a white adherent discharge wet prep positive for yeast, amines, clues, TNTC bacteria. Bimanual fibroid uterus 18 weeks size.  Bacteria vaginosis Yeast vaginitis Fibroid uterus/postmenopausal  Plan: Try over-the-counter Tums or Rolaids, if discomfort persists after rest, follow-up with primary care. If chest pain increases with radiating pain or nausea or shortness of breath call 911. Vital 500 twice daily for 7 days, alcohol precautions reviewed, Terazol 3 one applicator at bedtime 3, instructed to call for no relief of discharge. Aware of hazards of smoking is trying to cut down/quit.

## 2013-12-21 ENCOUNTER — Ambulatory Visit: Payer: BC Managed Care – PPO | Admitting: Family Medicine

## 2013-12-28 ENCOUNTER — Ambulatory Visit (INDEPENDENT_AMBULATORY_CARE_PROVIDER_SITE_OTHER): Payer: BC Managed Care – PPO | Admitting: Family Medicine

## 2013-12-28 ENCOUNTER — Encounter: Payer: Self-pay | Admitting: Family Medicine

## 2013-12-28 VITALS — BP 147/88 | HR 73 | Temp 98.7°F | Resp 16 | Ht 66.25 in | Wt 204.0 lb

## 2013-12-28 DIAGNOSIS — G479 Sleep disorder, unspecified: Secondary | ICD-10-CM

## 2013-12-28 DIAGNOSIS — Z789 Other specified health status: Secondary | ICD-10-CM

## 2013-12-28 DIAGNOSIS — M791 Myalgia, unspecified site: Secondary | ICD-10-CM

## 2013-12-28 DIAGNOSIS — M2550 Pain in unspecified joint: Secondary | ICD-10-CM

## 2013-12-28 DIAGNOSIS — R531 Weakness: Secondary | ICD-10-CM

## 2013-12-28 DIAGNOSIS — F1090 Alcohol use, unspecified, uncomplicated: Secondary | ICD-10-CM

## 2013-12-28 LAB — CBC WITH DIFFERENTIAL/PLATELET
BASOS ABS: 0.1 10*3/uL (ref 0.0–0.1)
BASOS PCT: 1 % (ref 0–1)
EOS ABS: 0.2 10*3/uL (ref 0.0–0.7)
Eosinophils Relative: 4 % (ref 0–5)
HCT: 36.1 % (ref 36.0–46.0)
Hemoglobin: 12.1 g/dL (ref 12.0–15.0)
Lymphocytes Relative: 38 % (ref 12–46)
Lymphs Abs: 2.3 10*3/uL (ref 0.7–4.0)
MCH: 30.3 pg (ref 26.0–34.0)
MCHC: 33.5 g/dL (ref 30.0–36.0)
MCV: 90.3 fL (ref 78.0–100.0)
MONOS PCT: 8 % (ref 3–12)
MPV: 9 fL — ABNORMAL LOW (ref 9.4–12.4)
Monocytes Absolute: 0.5 10*3/uL (ref 0.1–1.0)
Neutro Abs: 3 10*3/uL (ref 1.7–7.7)
Neutrophils Relative %: 49 % (ref 43–77)
Platelets: 421 10*3/uL — ABNORMAL HIGH (ref 150–400)
RBC: 4 MIL/uL (ref 3.87–5.11)
RDW: 13.3 % (ref 11.5–15.5)
WBC: 6.1 10*3/uL (ref 4.0–10.5)

## 2013-12-28 LAB — COMPREHENSIVE METABOLIC PANEL
ALBUMIN: 4.2 g/dL (ref 3.5–5.2)
ALT: 10 U/L (ref 0–35)
AST: 16 U/L (ref 0–37)
Alkaline Phosphatase: 73 U/L (ref 39–117)
BUN: 14 mg/dL (ref 6–23)
CHLORIDE: 105 meq/L (ref 96–112)
CO2: 29 mEq/L (ref 19–32)
Calcium: 9.5 mg/dL (ref 8.4–10.5)
Creat: 0.59 mg/dL (ref 0.50–1.10)
Glucose, Bld: 81 mg/dL (ref 70–99)
Potassium: 4.7 mEq/L (ref 3.5–5.3)
SODIUM: 140 meq/L (ref 135–145)
Total Bilirubin: 0.4 mg/dL (ref 0.2–1.2)
Total Protein: 6.7 g/dL (ref 6.0–8.3)

## 2013-12-28 LAB — CK: CK TOTAL: 94 U/L (ref 7–177)

## 2013-12-28 LAB — SEDIMENTATION RATE: SED RATE: 14 mm/h (ref 0–22)

## 2013-12-28 LAB — MAGNESIUM: MAGNESIUM: 2 mg/dL (ref 1.5–2.5)

## 2013-12-28 MED ORDER — MELOXICAM 7.5 MG PO TABS: 7.5000 mg | ORAL_TABLET | Freq: Every day | ORAL | Status: DC

## 2013-12-28 NOTE — Progress Notes (Signed)
Subjective:    Patient ID: Alyssa Butler, female    DOB: 1970-06-14, 43 y.o.   MRN: [id number]  HPI This is a 43 yo female who presents as a new patient to [facility] with bilateral arm pain for about 6 months. Pain is from shoulders down to hands. Patient is right hand dominant. Pain is bad in left shoulder. Both arms feel "tight" in joints and muscles. Feels weak. Has difficulty lifting even a gallon of milk. No trouble holding onto things, writing. Legs feel weak and she has trouble with balance. No falls. She feels as though her symptoms are getting worse. 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   Has a sensation of choking x2 while asleep. No vomitus. No sensation of reflux or pain. This was relieved spontaneously. She has a long standing history of constipation and has been worked up by GI. She has not followed recommendations to use metamucil or increase fiber. She eats a lot of fast food due to her work schedule.   She reports that she is under a lot of stress. She works 3 jobs and has little time to relax. She drinks 3-4 beers/day to help unwind. She has been seen by gyn for fibroids and is considering hysterectomy in 2016. She was prescribed Prozac but never took it. Didn't think it would help. Lives with her mother and feels that she has good support, although they don't get to spend much time together due to conflicting schedules.   Past Medical History  Diagnosis Date  . Fibroid   . Abdominal pain   . Heartburn   . GERD (gastroesophageal reflux disease)   . Constipation   . Rectal bleed   . Vision changes   . Muscle pain   . Skin rash   . 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 Tubular adenoma of colon 07/29/2012   Past Surgical History  Procedure Laterality Date  . Wisdom tooth extraction    . Therapeutic abortion     Family History  Problem Relation Age of Onset  . Heart attack  Maternal Uncle   . Kidney failure Maternal Grandmother   . Heart attack Maternal Grandfather    History   Social History  . Marital Status: Single    Spouse Name: N/A    Number of Children: N/A  . Years of Education: N/A   Occupational History  . Not on file.   Social History Main Topics  . Smoking status: Current Every Day Smoker -- 0.25 packs/day for 15 years    Types: Cigars  . Smokeless tobacco: Never Used  . Alcohol Use: 9.9 oz/week    3 Not specified, 14 Cans of beer per week  . Drug Use: No  . Sexual Activity: No   Other Topics Concern  . Not on file   Social History Narrative   Review of Systems  Constitutional: Positive for fatigue. Negative for fever, chills and unexpected weight change.  Respiratory: Positive for choking. Negative for chest tightness, shortness of breath and wheezing.   Cardiovascular: Negative for chest pain and leg swelling.  Gastrointestinal: Positive for abdominal pain and constipation. Negative for vomiting.  Genitourinary: Negative for dysuria, urgency, frequency and difficulty urinating.  Musculoskeletal: Positive for myalgias, arthralgias, neck pain and neck stiffness.  Psychiatric/Behavioral: Positive for dysphoric mood. Negative for suicidal ideas. Sleep disturbance: difficulty staying asleep.       Objective:  Physical Exam  Constitutional: She is oriented to person, place, and time. She appears well-developed and well-nourished. No distress.  HENT:  Head: Normocephalic and atraumatic.  Right Ear: External ear normal.  Left Ear: External ear normal.  Nose: Nose normal.  Mouth/Throat: Oropharynx is clear and moist. No oropharyngeal exudate.  Eyes: Conjunctivae are normal. Pupils are equal, round, and reactive to light.  Neck: Normal range of motion. Neck supple. No thyromegaly present.  Cardiovascular: Normal rate, regular rhythm, normal heart sounds and intact distal pulses.   Pulmonary/Chest: Effort normal and breath sounds  normal.  Abdominal: Soft. Bowel sounds are normal.  Musculoskeletal: Normal range of motion.  Lymphadenopathy:    She has no cervical adenopathy.  Neurological: She is alert and oriented to person, place, and time. She has normal reflexes. No cranial nerve deficit. Coordination normal.  Skin: Skin is warm and dry. She is not diaphoretic.  Psychiatric:  Slightly flat affect   Vitals reviewed. BP 147/88 mmHg  Pulse 73  Temp(Src) 98.7 F (37.1 C) (Oral)  Resp 16  Ht 5' 6.25" (1.683 m)  Wt 204 lb (92.534 kg)  BMI 32.67 kg/m2  SpO2 98%    Assessment & Plan:  1. Myalgia - Sedimentation Rate - Comprehensive metabolic panel - CBC with Differential - Magnesium - meloxicam (MOBIC) 7.5 MG tablet; Take 1 tablet (7.5 mg total) by mouth daily.  Dispense: 30 tablet; Refill: 1 - CK - Vit D  25 hydroxy (rtn osteoporosis monitoring)  2. Arthralgia - Sedimentation Rate - Comprehensive metabolic panel - CBC with Differential - Magnesium - meloxicam (MOBIC) 7.5 MG tablet; Take 1 tablet (7.5 mg total) by mouth daily.  Dispense: 30 tablet; Refill: 1 - Vit D  25 hydroxy (rtn osteoporosis monitoring)  3. Sleep disturbance -discussed affect of alcohol over use on quality of sleep  4. Weakness - Sedimentation Rate - Comprehensive metabolic panel - CBC with Differential - Magnesium - CK - Vit D  25 hydroxy (rtn osteoporosis monitoring)  5. Heavy alcohol use -Encouraged her to immediately decrease to no more than 2 servings per night  She has multiple issues and history of lack of follow through with prescribed treatment. Encouraged her to continue follow up with gyn as scheduled, alternating with PCP follow up to build relationship and encouraged healthy behaviors.    Elby Beck, FNP-BC  [facility]  12/30/2013 4:19 PM

## 2013-12-28 NOTE — Patient Instructions (Signed)
Please take meloxicam once a day for muscle aches and pains You will get a call about your lab work in the next couple of days Please increase your fiber. You can try colace (stool softener) and Miralax for your constipation Do gentle range of motion exercises twice a day from head to toes Decrease alcohol consumption to no more than 2 drinks a day    Constipation Constipation is when a person has fewer than three bowel movements a week, has difficulty having a bowel movement, or has stools that are dry, hard, or larger than normal. As people grow older, constipation is more common. If you try to fix constipation with medicines that make you have a bowel movement (laxatives), the problem may get worse. Long-term laxative use may cause the muscles of the colon to become weak. A low-fiber diet, not taking in enough fluids, and taking certain medicines may make constipation worse.  CAUSES   Certain medicines, such as antidepressants, pain medicine, iron supplements, antacids, and water pills.   Certain diseases, such as diabetes, irritable bowel syndrome (IBS), thyroid disease, or depression.   Not drinking enough water.   Not eating enough fiber-rich foods.   Stress or travel.   Lack of physical activity or exercise.   Ignoring the urge to have a bowel movement.   Using laxatives too much.  SIGNS AND SYMPTOMS   Having fewer than three bowel movements a week.   Straining to have a bowel movement.   Having stools that are hard, dry, or larger than normal.   Feeling full or bloated.   Pain in the lower abdomen.   Not feeling relief after having a bowel movement.  DIAGNOSIS  Your health care provider will take a medical history and perform a physical exam. Further testing may be done for severe constipation. Some tests may include:  A barium enema X-ray to examine your rectum, colon, and, sometimes, your small intestine.   A sigmoidoscopy to examine your lower  colon.   A colonoscopy to examine your entire colon. TREATMENT  Treatment will depend on the severity of your constipation and what is causing it. Some dietary treatments include drinking more fluids and eating more fiber-rich foods. Lifestyle treatments may include regular exercise. If these diet and lifestyle recommendations do not help, your health care provider may recommend taking over-the-counter laxative medicines to help you have bowel movements. Prescription medicines may be prescribed if over-the-counter medicines do not work.  HOME CARE INSTRUCTIONS   Eat foods that have a lot of fiber, such as fruits, vegetables, whole grains, and beans.  Limit foods high in fat and processed sugars, such as french fries, hamburgers, cookies, candies, and soda.   A fiber supplement may be added to your diet if you cannot get enough fiber from foods.   Drink enough fluids to keep your urine clear or pale yellow.   Exercise regularly or as directed by your health care provider.   Go to the restroom when you have the urge to go. Do not hold it.   Only take over-the-counter or prescription medicines as directed by your health care provider. Do not take other medicines for constipation without talking to your health care provider first.  [place] IF:   You have bright red blood in your stool.   Your constipation lasts for more than 4 days or gets worse.   You have abdominal or rectal pain.   You have thin, pencil-like stools.   You have  unexplained weight loss. MAKE SURE YOU:   Understand these instructions.  Will watch your condition.  Will get help right away if you are not doing well or get worse. Document Released: 09/29/2003 Document Revised: 01/05/2013 Document Reviewed: 10/12/2012 [facility] Patient Information 2015 [place], [place]. This information is not intended to replace advice given to you by your health care provider. Make sure you discuss any  questions you have with your health care provider.

## 2013-12-29 LAB — VITAMIN D 25 HYDROXY (VIT D DEFICIENCY, FRACTURES): Vit D, 25-Hydroxy: 27 ng/mL — ABNORMAL LOW (ref 30–100)

## 2013-12-30 ENCOUNTER — Other Ambulatory Visit: Payer: Self-pay | Admitting: Family Medicine

## 2013-12-30 DIAGNOSIS — E559 Vitamin D deficiency, unspecified: Secondary | ICD-10-CM

## 2013-12-30 MED ORDER — VITAMIN D (ERGOCALCIFEROL) 1.25 MG (50000 UNIT) PO CAPS: ORAL_CAPSULE | ORAL | Status: AC

## 2014-03-02 ENCOUNTER — Encounter: Payer: BC Managed Care – PPO | Admitting: Women's Health

## 2014-04-26 ENCOUNTER — Ambulatory Visit: Payer: BC Managed Care – PPO | Admitting: Family Medicine

## 2014-07-11 ENCOUNTER — Other Ambulatory Visit: Payer: Self-pay | Admitting: Family Medicine

## 2015-02-12 ENCOUNTER — Other Ambulatory Visit: Payer: Self-pay | Admitting: Physician Assistant

## 2015-03-03 ENCOUNTER — Encounter: Payer: Self-pay | Admitting: Women's Health

## 2015-03-03 ENCOUNTER — Ambulatory Visit (INDEPENDENT_AMBULATORY_CARE_PROVIDER_SITE_OTHER): Payer: PRIVATE HEALTH INSURANCE | Admitting: Women's Health

## 2015-03-03 VITALS — BP 130/78 | Ht 66.0 in | Wt 206.0 lb

## 2015-03-03 DIAGNOSIS — R109 Unspecified abdominal pain: Secondary | ICD-10-CM | POA: Diagnosis not present

## 2015-03-03 DIAGNOSIS — N760 Acute vaginitis: Secondary | ICD-10-CM | POA: Diagnosis not present

## 2015-03-03 DIAGNOSIS — B9689 Other specified bacterial agents as the cause of diseases classified elsewhere: Secondary | ICD-10-CM

## 2015-03-03 DIAGNOSIS — R350 Frequency of micturition: Secondary | ICD-10-CM

## 2015-03-03 DIAGNOSIS — A499 Bacterial infection, unspecified: Secondary | ICD-10-CM

## 2015-03-03 DIAGNOSIS — D259 Leiomyoma of uterus, unspecified: Secondary | ICD-10-CM

## 2015-03-03 DIAGNOSIS — R310 Gross hematuria: Secondary | ICD-10-CM | POA: Diagnosis not present

## 2015-03-03 LAB — URINALYSIS W MICROSCOPIC + REFLEX CULTURE
Bilirubin Urine: NEGATIVE
CASTS: NONE SEEN [LPF]
Crystals: NONE SEEN [HPF]
Glucose, UA: NEGATIVE
LEUKOCYTES UA: NEGATIVE
NITRITE: NEGATIVE
PH: 5 (ref 5.0–8.0)
WBC, UA: NONE SEEN WBC/HPF (ref <=5)
YEAST: NONE SEEN [HPF]

## 2015-03-03 LAB — WET PREP FOR TRICH, YEAST, CLUE
Trich, Wet Prep: NONE SEEN
WBC WET PREP: NONE SEEN
Yeast Wet Prep HPF POC: NONE SEEN

## 2015-03-03 MED ORDER — METRONIDAZOLE 500 MG PO TABS: 500.0000 mg | ORAL_TABLET | Freq: Two times a day (BID) | ORAL | Status: AC

## 2015-03-03 NOTE — Progress Notes (Signed)
Patient ID: Alyssa Butler, female   DOB: 04/28/1970, 45 y.o.   MRN: [id number] Presents with complaint of increased vaginal and pelvic pressure, vaginal discharge, and occasionally sees blood in urine. Denies constipation, fever, urinary pain or burning. Was treated for UTI in December with blood in urine. History of recurrent BV. Postmenopausal on no HRT, history of fibroids. Overdue for annual.  Exam: Affect flat, appears well. Abdomen enlarged, soft. External genitalia within normal limits, speculum exam moderate amount of a white adherent discharge with odor noted, wet prep positive for many clues, TNTC bacteria. Bimanual uterus enlarged palpated 18 weeks' size. GC/Chlamydia culture taken. UA: 3+ blood, trace protein, no wbc's, 40-60 RBCs, many bacteria, 10-20 squamous epithelials  Fibroid uterus Bacteria vaginosis Persistent Hematuria  Plan: Flagyl 500 twice daily for 7 days, alcohol precautions reviewed. Boric acid gelcaps 600 mg per vagina twice weekly thereafter. Continue condoms with intercourse, schedule annual exam. Ultrasound. Urine culture pending, if negative refer to urologist. Depression reviewed, reviewed medication may help and counseling encouraged, declines at this time.

## 2015-03-03 NOTE — Patient Instructions (Signed)

## 2015-03-04 LAB — GC/CHLAMYDIA PROBE AMP
CT Probe RNA: NOT DETECTED
GC PROBE AMP APTIMA: NOT DETECTED

## 2015-03-05 LAB — URINE CULTURE

## 2015-03-06 ENCOUNTER — Other Ambulatory Visit: Payer: Self-pay | Admitting: Women's Health

## 2015-03-06 DIAGNOSIS — R3129 Other microscopic hematuria: Secondary | ICD-10-CM

## 2015-03-09 ENCOUNTER — Other Ambulatory Visit: Payer: PRIVATE HEALTH INSURANCE

## 2015-03-09 DIAGNOSIS — R3129 Other microscopic hematuria: Secondary | ICD-10-CM

## 2015-03-10 LAB — URINALYSIS W MICROSCOPIC + REFLEX CULTURE
BACTERIA UA: NONE SEEN [HPF]
BILIRUBIN URINE: NEGATIVE
Casts: NONE SEEN [LPF]
Crystals: NONE SEEN [HPF]
GLUCOSE, UA: NEGATIVE
Ketones, ur: NEGATIVE
LEUKOCYTES UA: NEGATIVE
NITRITE: NEGATIVE
PH: 5.5 (ref 5.0–8.0)
Protein, ur: NEGATIVE
SPECIFIC GRAVITY, URINE: 1.028 (ref 1.001–1.035)
Squamous Epithelial / LPF: NONE SEEN [HPF] (ref <=5)
WBC UA: NONE SEEN WBC/HPF (ref <=5)
Yeast: NONE SEEN [HPF]

## 2015-03-11 LAB — URINE CULTURE
COLONY COUNT: NO GROWTH
ORGANISM ID, BACTERIA: NO GROWTH

## 2015-06-27 ENCOUNTER — Emergency Department (HOSPITAL_COMMUNITY): Payer: PRIVATE HEALTH INSURANCE

## 2015-06-27 ENCOUNTER — Encounter (HOSPITAL_COMMUNITY): Payer: Self-pay

## 2015-06-27 ENCOUNTER — Emergency Department (HOSPITAL_COMMUNITY)
Admission: EM | Admit: 2015-06-27 | Discharge: 2015-06-27 | Disposition: A | Discharge status: Home / Self Care | Payer: PRIVATE HEALTH INSURANCE | Attending: Emergency Medicine | Admitting: Emergency Medicine

## 2015-06-27 DIAGNOSIS — M5136 Other intervertebral disc degeneration, lumbar region: Secondary | ICD-10-CM | POA: Diagnosis not present

## 2015-06-27 DIAGNOSIS — D259 Leiomyoma of uterus, unspecified: Secondary | ICD-10-CM

## 2015-06-27 DIAGNOSIS — M609 Myositis, unspecified: Secondary | ICD-10-CM | POA: Diagnosis not present

## 2015-06-27 DIAGNOSIS — R109 Unspecified abdominal pain: Secondary | ICD-10-CM

## 2015-06-27 DIAGNOSIS — F17210 Nicotine dependence, cigarettes, uncomplicated: Secondary | ICD-10-CM | POA: Diagnosis not present

## 2015-06-27 DIAGNOSIS — M545 Low back pain, unspecified: Secondary | ICD-10-CM

## 2015-06-27 DIAGNOSIS — Z85038 Personal history of other malignant neoplasm of large intestine: Secondary | ICD-10-CM | POA: Insufficient documentation

## 2015-06-27 DIAGNOSIS — Z79899 Other long term (current) drug therapy: Secondary | ICD-10-CM | POA: Insufficient documentation

## 2015-06-27 LAB — COMPREHENSIVE METABOLIC PANEL
ALK PHOS: 75 U/L (ref 38–126)
ALT: 14 U/L (ref 14–54)
ANION GAP: 8 (ref 5–15)
AST: 26 U/L (ref 15–41)
Albumin: 5.2 g/dL — ABNORMAL HIGH (ref 3.5–5.0)
BILIRUBIN TOTAL: 0.8 mg/dL (ref 0.3–1.2)
BUN: 18 mg/dL (ref 6–20)
CALCIUM: 9.6 mg/dL (ref 8.9–10.3)
CO2: 27 mmol/L (ref 22–32)
Chloride: 105 mmol/L (ref 101–111)
Creatinine, Ser: 0.68 mg/dL (ref 0.44–1.00)
GFR calc Af Amer: >60 mL/min (ref >60)
Glucose, Bld: 102 mg/dL — ABNORMAL HIGH (ref 65–99)
POTASSIUM: 4.3 mmol/L (ref 3.5–5.1)
Sodium: 140 mmol/L (ref 135–145)
TOTAL PROTEIN: 8.5 g/dL — AB (ref 6.5–8.1)

## 2015-06-27 LAB — URINE MICROSCOPIC-ADD ON

## 2015-06-27 LAB — URINALYSIS, ROUTINE W REFLEX MICROSCOPIC
BILIRUBIN URINE: NEGATIVE
Glucose, UA: NEGATIVE mg/dL
KETONES UR: NEGATIVE mg/dL
LEUKOCYTES UA: NEGATIVE
NITRITE: NEGATIVE
PH: 5.5 (ref 5.0–8.0)
Protein, ur: NEGATIVE mg/dL
Specific Gravity, Urine: >1.046 — ABNORMAL HIGH (ref 1.005–1.030)

## 2015-06-27 LAB — CBC
HEMATOCRIT: 38 % (ref 36.0–46.0)
HEMOGLOBIN: 13.2 g/dL (ref 12.0–15.0)
MCH: 31.4 pg (ref 26.0–34.0)
MCHC: 34.7 g/dL (ref 30.0–36.0)
MCV: 90.3 fL (ref 78.0–100.0)
Platelets: 357 10*3/uL (ref 150–400)
RBC: 4.21 MIL/uL (ref 3.87–5.11)
RDW: 13.1 % (ref 11.5–15.5)
WBC: 9 10*3/uL (ref 4.0–10.5)

## 2015-06-27 LAB — LIPASE, BLOOD: Lipase: 28 U/L (ref 11–51)

## 2015-06-27 LAB — I-STAT BETA HCG BLOOD, ED (MC, WL, AP ONLY)

## 2015-06-27 MED ORDER — DIAZEPAM 5 MG PO TABS
10.0000 mg | ORAL_TABLET | Freq: Once | ORAL | Status: AC
  Administered 2015-06-27: 10 mg via ORAL
  Filled 2015-06-27: qty 2

## 2015-06-27 MED ORDER — SODIUM CHLORIDE 0.9 % IV SOLN
INTRAVENOUS | Status: DC
Start: 2015-06-27 — End: 2015-06-27
  Administered 2015-06-27: 09:00:00 via INTRAVENOUS

## 2015-06-27 MED ORDER — HYDROMORPHONE HCL 1 MG/ML IJ SOLN
0.5000 mg | Freq: Once | INTRAMUSCULAR | Status: AC
  Administered 2015-06-27: 0.5 mg via INTRAVENOUS
  Filled 2015-06-27: qty 1

## 2015-06-27 MED ORDER — HYDROMORPHONE HCL 1 MG/ML IJ SOLN
1.0000 mg | Freq: Once | INTRAMUSCULAR | Status: AC
  Administered 2015-06-27: 1 mg via INTRAVENOUS
  Filled 2015-06-27: qty 1

## 2015-06-27 MED ORDER — ONDANSETRON HCL 4 MG/2ML IJ SOLN
4.0000 mg | Freq: Once | INTRAMUSCULAR | Status: AC
  Administered 2015-06-27: 4 mg via INTRAVENOUS
  Filled 2015-06-27: qty 2

## 2015-06-27 MED ORDER — DIAZEPAM 10 MG PO TABS: 10.0000 mg | ORAL_TABLET | Freq: Four times a day (QID) | ORAL | Status: AC | PRN

## 2015-06-27 MED ORDER — SODIUM CHLORIDE 0.9 % IV BOLUS (SEPSIS)
500.0000 mL | Freq: Once | INTRAVENOUS | Status: AC
  Administered 2015-06-27: 500 mL via INTRAVENOUS

## 2015-06-27 MED ORDER — PREDNISONE 20 MG PO TABS: 20.0000 mg | ORAL_TABLET | Freq: Two times a day (BID) | ORAL | Status: AC

## 2015-06-27 MED ORDER — IOPAMIDOL (ISOVUE-300) INJECTION 61%
100.0000 mL | Freq: Once | INTRAVENOUS | Status: AC | PRN
  Administered 2015-06-27: 100 mL via INTRAVENOUS

## 2015-06-27 MED ORDER — DIATRIZOATE MEGLUMINE & SODIUM 66-10 % PO SOLN
15.0000 mL | Freq: Once | ORAL | Status: AC
  Administered 2015-06-27: 15 mL via ORAL

## 2015-06-27 MED ORDER — HYDROCODONE-ACETAMINOPHEN 5-325 MG PO TABS: 1.0000 | ORAL_TABLET | ORAL | Status: AC | PRN

## 2015-06-27 NOTE — ED Notes (Signed)
RN drawing labs 

## 2015-06-27 NOTE — ED Notes (Signed)
Patient transported to CT 

## 2015-06-27 NOTE — ED Notes (Signed)
Patient transported to MRI 

## 2015-06-27 NOTE — ED Provider Notes (Addendum)
CSN: [id number]     Arrival date & time 06/27/15  0745 History   First MD Initiated Contact with Patient 06/27/15 0800     Chief Complaint  Patient presents with  . Abdominal Pain     (Consider location/radiation/quality/duration/timing/severity/associated sxs/prior Treatment) HPI Alyssa Butler is a 45 y.o. female who presents for evaluation of lower back pain radiating to right leg, and lower abdominal pain. Pain onset last night, after watching a basketball game on TV. She was unable to sleep last night because of the pain. The pain is aggravated by sitting or lying down. She had a normal bowel movement this morning. She does have chronic constipation for which she takes Linzess. There's been no fever, chills, cough, shortness of breath, chest pain, nausea or vomiting. No known trauma. No chronic back pain. There are no other known modifying factors.   Past Medical History  Diagnosis Date  . Fibroid   . Abdominal pain   . Heartburn   . GERD (gastroesophageal reflux disease)   . Constipation   . Rectal bleed   . Vision changes   . Muscle pain   . Skin rash   . 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 Tubular adenoma of colon 07/29/2012   Past Surgical History  Procedure Laterality Date  . Wisdom tooth extraction    . Therapeutic abortion     Family History  Problem Relation Age of Onset  . Heart attack Maternal Uncle   . Kidney failure Maternal Grandmother   . Heart attack Maternal Grandfather    Social History  Substance Use Topics  . Smoking status: Current Every Day Smoker -- 0.25 packs/day for 15 years    Types: Cigars  . Smokeless tobacco: Never Used  . Alcohol Use: 9.9 oz/week    3 Standard drinks or equivalent, 14 Cans of beer per week   OB History    Gravida Para Term Preterm AB TAB SAB Ectopic Multiple Living   1    1 1          Review of Systems  All other systems reviewed and are negative.     Allergies  Review of patient's  allergies indicates no known allergies.  Home Medications   Prior to Admission medications   Medication Sig Start Date End Date Taking? Authorizing Provider  LINZESS 145 MCG CAPS capsule Take 145 mcg by mouth daily. 03/30/15  Yes Historical Provider, MD  meloxicam (MOBIC) 7.5 MG tablet TAKE 1 TABLET BY MOUTH EVERY DAY.  "OV NEEDED" 07/13/14  Yes Chelle Jeffery, PA-C  Multiple Vitamin (MULTIVITAMIN WITH MINERALS) TABS tablet Take 1 tablet by mouth every other day.   Yes Historical Provider, MD  Omega-3 Fatty Acids (FISH OIL PO) Take by mouth.   Yes Historical Provider, MD  diazepam (VALIUM) 10 MG tablet Take 1 tablet (10 mg total) by mouth every 6 (six) hours as needed (Muscle spasm). 06/27/15   Daleen Bo, MD  HYDROcodone-acetaminophen (NORCO) 5-325 MG tablet Take 1-2 tablets by mouth every 4 (four) hours as needed. 06/27/15   Daleen Bo, MD  metroNIDAZOLE (FLAGYL) 500 MG tablet Take 1 tablet (500 mg total) by mouth 2 (two) times daily. Patient not taking: Reported on 06/27/2015 03/03/15   Huel Cote, NP  predniSONE (DELTASONE) 20 MG tablet Take 1 tablet (20 mg total) by mouth 2 (two) times daily. 06/27/15   Daleen Bo, MD  Vitamin D, Ergocalciferol, (DRISDOL) 50000 UNITS CAPS capsule Take 1 capsule weekly for 8  weeks, then 1 every other week Patient not taking: Reported on 06/27/2015 12/30/13   Elby Beck, FNP   BP 130/64 mmHg  Pulse 71  Temp(Src) 98.4 F (36.9 C) (Oral)  Resp 15  SpO2 99%  LMP 03/08/2013 Physical Exam  Constitutional: She is oriented to person, place, and time. She appears well-developed and well-nourished. She appears distressed (She is uncomfortable.).  HENT:  Head: Normocephalic and atraumatic.  Right Ear: External ear normal.  Left Ear: External ear normal.  Eyes: Conjunctivae and EOM are normal. Pupils are equal, round, and reactive to light.  Neck: Normal range of motion and phonation normal. Neck supple.  Cardiovascular: Normal rate, regular  rhythm and normal heart sounds.   Pulmonary/Chest: Effort normal and breath sounds normal. She exhibits no bony tenderness.  Abdominal: Soft. Bowel sounds are normal. She exhibits distension. She exhibits no mass. There is tenderness (Mild right lower quadrant tenderness with palpation.). There is no rebound and no guarding.  Musculoskeletal: Normal range of motion. She exhibits no edema or tenderness.  Normal gait. Mild bilateral lumbar tenderness to palpation. She resists flexion extension of the back secondary to pain. No significant buttocks tenderness.  Neurological: She is alert and oriented to person, place, and time. No cranial nerve deficit or sensory deficit. She exhibits normal muscle tone. Coordination normal.  Skin: Skin is warm, dry and intact.  Psychiatric: She has a normal mood and affect. Her behavior is normal. Judgment and thought content normal.  Nursing note and vitals reviewed.   ED Course  Procedures (including critical care time)  Initial clinical impression- unclear pain, with compulsive, musculoskeletal and visceral abdominal sources as possibilities. Patient is nontoxic, illness is short-term. We'll evaluate with imaging, treat pain and reassess.   Medications  0.9 %  sodium chloride infusion ( Intravenous Stopped 06/27/15 1138)  ondansetron (ZOFRAN) injection 4 mg (4 mg Intravenous Given 06/27/15 0838)  HYDROmorphone (DILAUDID) injection 1 mg (1 mg Intravenous Given 06/27/15 0838)  sodium chloride 0.9 % bolus 500 mL (0 mLs Intravenous Stopped 06/27/15 1138)  diatrizoate meglumine-sodium (GASTROGRAFIN) 66-10 % solution 15 mL (15 mLs Oral Given 06/27/15 0832)  iopamidol (ISOVUE-300) 61 % injection 100 mL (100 mLs Intravenous Contrast Given 06/27/15 0939)  HYDROmorphone (DILAUDID) injection 0.5 mg (0.5 mg Intravenous Given 06/27/15 1135)  diazepam (VALIUM) tablet 10 mg (10 mg Oral Given 06/27/15 1148)    Patient Vitals for the past 24 hrs:  BP Temp Temp src Pulse Resp  SpO2  06/27/15 1505 130/64 mmHg - - 71 15 99 %  06/27/15 1154 141/84 mmHg - - 84 18 100 %  06/27/15 0751 153/84 mmHg 98.4 F (36.9 C) Oral 72 17 99 %    10:59 AM Reevaluation with update and discussion. After initial assessment and treatment, an updated evaluation reveals She is more comfortable now, but feels like she needs some more medication, prior to getting  in the car and going home. She states that she is aware of her uterine fibroid in the 80s, being followed by her gynecologist. She recalls the size was bigger than it is today. Findings discussed with the patient. All questions were answered. Khaniya Tenaglia L   15:12- after initial discharge, at 11:15 the patient needed more medication. She was given Valium without relief. An MRI was done to further diagnose her source of discomfort. At this time, the MRI is returned, and the patient remains fairly comfortable. I discussed the findings with the patient and all questions were answered.  Labs Review  Labs Reviewed  COMPREHENSIVE METABOLIC PANEL - Abnormal; Notable for the following:    Glucose, Bld 102 (*)    Total Protein 8.5 (*)    Albumin 5.2 (*)    All other components within normal limits  URINALYSIS, ROUTINE W REFLEX MICROSCOPIC (NOT AT [facility]) - Abnormal; Notable for the following:    Specific Gravity, Urine >1.046 (*)    Hgb urine dipstick SMALL (*)    All other components within normal limits  URINE MICROSCOPIC-ADD ON - Abnormal; Notable for the following:    Squamous Epithelial / LPF 0-5 (*)    Bacteria, UA RARE (*)    All other components within normal limits  LIPASE, BLOOD  CBC  I-STAT BETA HCG BLOOD, ED ([facility], [facility], [facility] ONLY)    Imaging Review Mr Lumbar Spine Wo Contrast  06/27/2015  CLINICAL DATA:  45 year old female with lumbar back pain radiating to the right leg and lower abdomen since last night. Symptoms increased by sitting or lying down. No known injury. Initial encounter. EXAM: MRI LUMBAR SPINE WITHOUT CONTRAST  TECHNIQUE: Multiplanar, multisequence MR imaging of the lumbar spine was performed. No intravenous contrast was administered. COMPARISON:  CT Abdomen and Pelvis 0952 hours today. FINDINGS: Segmentation:  Normal as seen by CT today. Alignment:  Normal. Vertebrae: No marrow edema or evidence of acute osseous abnormality. Conus medullaris: Extends to the L1 level and appears normal. No signal abnormality in the lower thoracic spinal cord. Paraspinal and other soft tissues: Negative visualized abdominal viscera. Patchy abnormal T2 and STIR hyperintensity in the medial right psoas muscle at L2-L3 (series 5 image 2), see details of that disc space below. Disc levels: T11-T12:  Mild right facet hypertrophy.  Otherwise negative. T12-L1:  Negative. L1-L2:  Borderline to mild facet hypertrophy.  Otherwise negative. L2-L3: Mild circumferential disc bulge. There does appear to be a broad-based far lateral disc protrusion on the right in proximity to the patchy edema at the right psoas muscle as seen on series 5, image 2. There is no posterior or foraminal disc disease. There is mild facet hypertrophy with trace facet joint fluid. No stenosis. L3-L4: Mild disc desiccation and disc space loss. Circumferential disc bulge. Mild facet and ligament flavum hypertrophy. No stenosis. L4-L5: Negative disc. Mild to moderate facet hypertrophy greater on the left. Borderline to mild left L4 foraminal stenosis. L5-S1: Small right lateral recess broad-based disc protrusion best seen on series 3, image 6 and series 6, image 28. Mild posterior displacement of the descending right S1 nerve roots. Mild facet hypertrophy. No spinal stenosis. No foraminal stenosis. IMPRESSION: 1. Mild patchy nonspecific edema in the medial right psoas muscle at the L2-L3 level which is nonspecific but I favor is the symptomatic abnormality. This may be related to a mild right far lateral disc herniation which does not affect the spinal canal or neural foramen at  this level. There is no evidence of acute osseous abnormality or spinal infection. 2. Small rightward disc herniation at L5-S1 with mild displacement of the descending right S1 nerve roots in the lateral recess. 3. Mild lumbar facet degeneration elsewhere.  No spinal stenosis. Electronically Signed   By: Genevie Ann M.D.   On: 06/27/2015 14:19   Ct Abdomen Pelvis W Contrast  06/27/2015  CLINICAL DATA:  Right side abdominal pain starting last night EXAM: CT ABDOMEN AND PELVIS WITH CONTRAST TECHNIQUE: Multidetector CT imaging of the abdomen and pelvis was performed using the standard protocol following bolus administration of intravenous contrast. CONTRAST:  13mL  ISOVUE-300 IOPAMIDOL (ISOVUE-300) INJECTION 61% COMPARISON:  None. FINDINGS: Lower chest:  Lung bases are unremarkable. Hepatobiliary: Enhanced liver shows no focal mass. Hypervascular focus in right hepatic lobe laterally measures 6 mm probable atypical hemangioma. This is not identified on delayed images. Pancreas: Enhanced pancreas is unremarkable. Spleen: Enhanced spleen is unremarkable. Adrenals/Urinary Tract: No adrenal gland mass. Enhanced kidneys are symmetrical in appearance. No hydronephrosis or hydroureter. Delayed renal images shows bilateral renal symmetrical excretion. Bilateral visualized proximal ureter is unremarkable. Stomach/Bowel: No gastric outlet obstruction. No small bowel obstruction. Some colonic stool noted in right colon and proximal transverse colon. Few diverticula are noted proximal transverse colon. No evidence of colitis or acute diverticulitis. Normal appendix is clearly visualized in axial image 61. No pericecal inflammation. No distal colonic obstruction. No distal colitis. Vascular/Lymphatic: Minimal atherosclerotic calcifications of distal abdominal aorta. No aortic aneurysm. No retroperitoneal or mesenteric adenopathy. No mesenteric fluid collection. Reproductive: There is enlarged uterus with nodular appearance of the  fundus. Heterogeneous lesion midline uterine fundus measures 6.6 cm highly suspicious for myometrial fibroid. Submucosal fibroid cannot be excluded. There is distortion of endometrial cavity. Correlation with GYN exam and further correlation with ultrasound or MRI is recommended as clinically warranted. No adnexal mass. The ovaries are unremarkable. Other: No ascites or free air. Musculoskeletal: No destructive bony lesions are noted. Mild degenerative changes lumbar spine. Mild disc space flattening with mild anterior spurring and mild posterior disc bulge at L3-L4 level. IMPRESSION: 1. There is no evidence of acute inflammatory process within abdomen. 2. No hydronephrosis or hydroureter. 3. Normal appendix. No pericecal inflammation. No evidence of colitis or diverticulitis. 4. There is enlarged uterus with nodular appearance of the fundus. Heterogeneous lesion midline uterine fundus measures 6.6 cm highly suspicious for myometrial fibroid. Submucosal fibroid cannot be excluded. There is distortion of endometrial cavity. Correlation with GYN exam and further evaluation with ultrasound or MRI is recommended as clinically warranted. 5. Mild degenerative changes lumbar spine. 6. Question atypical hemangioma right hepatic lobe measures 6 mm. Further correlation with ultrasound or MRI could be performed as clinically warranted. Electronically Signed   By: Lahoma Crocker M.D.   On: 06/27/2015 10:10   I have personally reviewed and evaluated these images and lab results as part of my medical decision-making.   EKG Interpretation None      MDM   Final diagnoses:  Right-sided low back pain without sciatica  Abdominal pain, unspecified abdominal location  Uterine leiomyoma, unspecified location  Degenerative disc disease, lumbar  Myositis    Nonspecific lower back and abdominal pain. Imaging negative for acute intra-abdominal visceral abnormality or apparent spinal abnormality. Patient does have degenerative  disease disease, lumbar, with findings of psoas muscle inflammation, which is likely the largest component of her discomfort. She does have a disc at L5-S1 which is pressing on the right spinal nerve root, but she does not have symptoms consistent with this. There is no indication for acute spinal myelopathy, or indication for urgent surgery at this time.     Nursing Notes Reviewed/ Care Coordinated Applicable Imaging Reviewed Interpretation of Laboratory Data incorporated into ED treatment  The patient appears reasonably screened and/or stabilized for discharge and I doubt any other medical condition or other [facility] requiring further screening, evaluation, or treatment in the ED at this time prior to discharge.  Plan: Home Medications- Norco, Prednisone, Valium; Home Treatments- Rest, Heat; return here if the recommended treatment, does not improve the symptoms; Recommended follow up- PCP prn  Daleen Bo, MD 06/27/15 [phone]

## 2015-06-27 NOTE — ED Notes (Signed)
Pt with rt lower abdominal pain.  No vaginal discharge.  No fever.  No n/v  Started last night

## 2015-06-27 NOTE — ED Notes (Signed)
Prescription and d/c instructions reviewed with patient. Patient verbalized understanding of same. Signature keypad in room not working.

## 2015-06-27 NOTE — Discharge Instructions (Signed)
The MRI shows degenerative disc disease of the spine as well as inflammation of the psoas muscle. Use heat on the sore areas 3-4 times a day for 1 hour. Call the orthopedic doctor for a follow-up appointment.   Degenerative Disk Disease Degenerative disk disease is a condition caused by the changes that occur in spinal disks as you grow older. Spinal disks are soft and compressible disks located between the bones of your spine (vertebrae). These disks act like shock absorbers. Degenerative disk disease can affect the whole spine. However, the neck and lower back are most commonly affected. Many changes can occur in the spinal disks with aging, such as:  The spinal disks may dry and shrink.  Small tears may occur in the tough, outer covering of the disk (annulus).  The disk space may become smaller due to loss of water.  Abnormal growths in the bone (spurs) may occur. This can put pressure on the nerve roots exiting the spinal canal, causing pain.  The spinal canal may become narrowed. RISK FACTORS   Being overweight.  Having a family history of degenerative disk disease.  Smoking.  There is increased risk if you are doing heavy lifting or have a sudden injury. SIGNS AND SYMPTOMS  Symptoms vary from person to person and may include:  Pain that varies in intensity. Some people have no pain, while others have severe pain. The location of the pain depends on the part of your backbone that is affected.  You will have neck or arm pain if a disk in the neck area is affected.  You will have pain in your back, buttocks, or legs if a disk in the lower back is affected.  Pain that becomes worse while bending, reaching up, or with twisting movements.  Pain that may start gradually and then get worse as time passes. It may also start after a major or minor injury.  Numbness or tingling in the arms or legs. DIAGNOSIS  Your health care provider will ask you about your symptoms and about  activities or habits that may cause the pain. He or she may also ask about any injuries, diseases, or treatments you have had. Your health care provider will examine you to check for the range of movement that is possible in the affected area, to check for strength in your extremities, and to check for sensation in the areas of the arms and legs supplied by different nerve roots. You may also have:   An X-ray of the spine.  Other imaging tests, such as MRI. TREATMENT  Your health care provider will advise you on the best plan for treatment. Treatment may include:  Medicines.  Rehabilitation exercises. HOME CARE INSTRUCTIONS   Follow proper lifting and walking techniques as advised by your health care provider.  Maintain good posture.  Exercise regularly as advised by your health care provider.  Perform relaxation exercises.  Change your sitting, standing, and sleeping habits as advised by your health care provider.  Change positions frequently.  Lose weight or maintain a healthy weight as advised by your health care provider.  Do not use any tobacco products, including cigarettes, chewing tobacco, or electronic cigarettes. If you need help quitting, ask your health care provider.  Wear supportive footwear.  Take medicines only as directed by your health care provider. SEEK MEDICAL CARE IF:   Your pain does not go away within 1-4 weeks.  You have significant appetite or weight loss. SEEK IMMEDIATE MEDICAL CARE IF:  Your pain is severe.  You notice weakness in your arms, hands, or legs.  You begin to lose control of your bladder or bowel movements.  You have fevers or night sweats. MAKE SURE YOU:   Understand these instructions.  Will watch your condition.  Will get help right away if you are not doing well or get worse.   This information is not intended to replace advice given to you by your health care provider. Make sure you discuss any questions you have with  your health care provider.   Document Released: 10/28/2006 Document Revised: 01/21/2014 Document Reviewed: 05/04/2013 Elsevier Interactive Patient Education 2016 Elsevier Inc[employer]   Abdominal Pain, Adult Many things can cause abdominal pain. Usually, abdominal pain is not caused by a disease and will improve without treatment. It can often be observed and treated at home. Your health care provider will do a physical exam and possibly order blood tests and X-rays to help determine the seriousness of your pain. However, in many cases, more time must pass before a clear cause of the pain can be found. Before that point, your health care provider may not know if you need more testing or further treatment. HOME CARE INSTRUCTIONS Monitor your abdominal pain for any changes. The following actions may help to alleviate any discomfort you are experiencing:  Only take over-the-counter or prescription medicines as directed by your health care provider.  Do not take laxatives unless directed to do so by your health care provider.  Try a clear liquid diet (broth, tea, or water) as directed by your health care provider. Slowly move to a bland diet as tolerated. SEEK MEDICAL CARE IF:  You have unexplained abdominal pain.  You have abdominal pain associated with nausea or diarrhea.  You have pain when you urinate or have a bowel movement.  You experience abdominal pain that wakes you in the night.  You have abdominal pain that is worsened or improved by eating food.  You have abdominal pain that is worsened with eating fatty foods.  You have a fever. SEEK IMMEDIATE MEDICAL CARE IF:  Your pain does not go away within 2 hours.  You keep throwing up (vomiting).  Your pain is felt only in portions of the abdomen, such as the right side or the left lower portion of the abdomen.  You pass bloody or black tarry stools. MAKE SURE YOU:  Understand these instructions.  Will watch your  condition.  Will get help right away if you are not doing well or get worse.   This information is not intended to replace advice given to you by your health care provider. Make sure you discuss any questions you have with your health care provider.   Document Released: 10/10/2004 Document Revised: 09/21/2014 Document Reviewed: 09/09/2012 Elsevier Interactive Patient Education 2016 Elsevier Inc.  Back Pain, Adult Back pain is very common in adults.The cause of back pain is rarely dangerous and the pain often gets better over time.The cause of your back pain may not be known. Some common causes of back pain include:  Strain of the muscles or ligaments supporting the spine.  Wear and tear (degeneration) of the spinal disks.  Arthritis.  Direct injury to the back. For many people, back pain may return. Since back pain is rarely dangerous, most people can learn to manage this condition on their own. HOME CARE INSTRUCTIONS Watch your back pain for any changes. The following actions may help to lessen any discomfort you are feeling:  Remain active. It is stressful on your back to sit or stand in one place for long periods of time. Do not sit, drive, or stand in one place for more than 30 minutes at a time. Take short walks on even surfaces as soon as you are able.Try to increase the length of time you walk each day.  Exercise regularly as directed by your health care provider. Exercise helps your back heal faster. It also helps avoid future injury by keeping your muscles strong and flexible.  Do not stay in bed.Resting more than 1-2 days can delay your recovery.  Pay attention to your body when you bend and lift. The most comfortable positions are those that put less stress on your recovering back. Always use proper lifting techniques, including:  Bending your knees.  Keeping the load close to your body.  Avoiding twisting.  Find a comfortable position to sleep. Use a firm  mattress and lie on your side with your knees slightly bent. If you lie on your back, put a pillow under your knees.  Avoid feeling anxious or stressed.Stress increases muscle tension and can worsen back pain.It is important to recognize when you are anxious or stressed and learn ways to manage it, such as with exercise.  Take medicines only as directed by your health care provider. Over-the-counter medicines to reduce pain and inflammation are often the most helpful.Your health care provider may prescribe muscle relaxant drugs.These medicines help dull your pain so you can more quickly return to your normal activities and healthy exercise.  Apply ice to the injured area:  Put ice in a plastic bag.  Place a towel between your skin and the bag.  Leave the ice on for 20 minutes, 2-3 times a day for the first 2-3 days. After that, ice and heat may be alternated to reduce pain and spasms.  Maintain a healthy weight. Excess weight puts extra stress on your back and makes it difficult to maintain good posture. SEEK MEDICAL CARE IF:  You have pain that is not relieved with rest or medicine.  You have increasing pain going down into the legs or buttocks.  You have pain that does not improve in one week.  You have night pain.  You lose weight.  You have a fever or chills. SEEK IMMEDIATE MEDICAL CARE IF:   You develop new bowel or bladder control problems.  You have unusual weakness or numbness in your arms or legs.  You develop nausea or vomiting.  You develop abdominal pain.  You feel faint.   This information is not intended to replace advice given to you by your health care provider. Make sure you discuss any questions you have with your health care provider.   Document Released: 12/31/2004 Document Revised: 01/21/2014 Document Reviewed: 05/04/2013 Elsevier Interactive Patient Education 2016 Else[employer] Inc.  Uterine Fibroids Uterine fibroids are tissue masses (tumors) that  can develop in the womb (uterus). They are also called leiomyomas. This type of tumor is not cancerous (benign) and does not spread to other parts of the body outside of the pelvic area, which is between the hip bones. Occasionally, fibroids may develop in the fallopian tubes, in the cervix, or on the support structures (ligaments) that surround the uterus. You can have one or many fibroids. Fibroids can vary in size, weight, and where they grow in the uterus. Some can become quite large. Most fibroids do not require medical treatment. CAUSES A fibroid can develop when a single uterine cell  keeps growing (replicating). Most cells in the human body have a control mechanism that keeps them from replicating without control. SIGNS AND SYMPTOMS Symptoms may include:   Heavy bleeding during your period.  Bleeding or spotting between periods.  Pelvic pain and pressure.  Bladder problems, such as needing to urinate more often (urinary frequency) or urgently.  Inability to reproduce offspring (infertility).  Miscarriages. DIAGNOSIS Uterine fibroids are diagnosed through a physical exam. Your health care provider may feel the lumpy tumors during a pelvic exam. Ultrasonography and an MRI may be done to determine the size, location, and number of fibroids. TREATMENT Treatment may include:  Watchful waiting. This involves getting the fibroid checked by your health care provider to see if it grows or shrinks. Follow your health care provider's recommendations for how often to have this checked.  Hormone medicines. These can be taken by mouth or given through an intrauterine device (IUD).  Surgery.  Removing the fibroids (myomectomy) or the uterus (hysterectomy).  Removing blood supply to the fibroids (uterine artery embolization). If fibroids interfere with your fertility and you want to become pregnant, your health care provider may recommend having the fibroids removed.  HOME CARE  INSTRUCTIONS  Keep all follow-up visits as directed by your health care provider. This is important.  Take medicines only as directed by your health care provider.  If you were prescribed a hormone treatment, take the hormone medicines exactly as directed.  Do not take aspirin, because it can cause bleeding.  Ask your health care provider about taking iron pills and increasing the amount of dark green, leafy vegetables in your diet. These actions can help to boost your blood iron levels, which may be affected by heavy menstrual bleeding.  Pay close attention to your period and tell your health care provider about any changes, such as:  Increased blood flow that requires you to use more pads or tampons than usual per month.  A change in the number of days that your period lasts per month.  A change in symptoms that are associated with your period, such as abdominal cramping or back pain. SEEK MEDICAL CARE IF:  You have pelvic pain, back pain, or abdominal cramps that cannot be controlled with medicines.  You have an increase in bleeding between and during periods.  You soak tampons or pads in a half hour or less.  You feel lightheaded, extra tired, or weak. SEEK IMMEDIATE MEDICAL CARE IF:  You faint.  You have a sudden increase in pelvic pain.   This information is not intended to replace advice given to you by your health care provider. Make sure you discuss any questions you have with your health care provider.   Document Released: 12/29/1999 Document Revised: 01/21/2014 Document Reviewed: 06/29/2013 Elsevier Interactive Patient Education [employer].

## 2016-04-11 ENCOUNTER — Emergency Department (HOSPITAL_COMMUNITY)
Admission: EM | Admit: 2016-04-11 | Discharge: 2016-04-11 | Disposition: A | Discharge status: Home / Self Care | Payer: No Typology Code available for payment source | Attending: Emergency Medicine | Admitting: Emergency Medicine

## 2016-04-11 ENCOUNTER — Encounter (HOSPITAL_COMMUNITY): Payer: Self-pay | Admitting: Emergency Medicine

## 2016-04-11 DIAGNOSIS — F17290 Nicotine dependence, other tobacco product, uncomplicated: Secondary | ICD-10-CM | POA: Diagnosis not present

## 2016-04-11 DIAGNOSIS — G44209 Tension-type headache, unspecified, not intractable: Secondary | ICD-10-CM | POA: Diagnosis not present

## 2016-04-11 DIAGNOSIS — M542 Cervicalgia: Secondary | ICD-10-CM | POA: Insufficient documentation

## 2016-04-11 DIAGNOSIS — Y999 Unspecified external cause status: Secondary | ICD-10-CM | POA: Insufficient documentation

## 2016-04-11 DIAGNOSIS — Y92410 Unspecified street and highway as the place of occurrence of the external cause: Secondary | ICD-10-CM | POA: Diagnosis not present

## 2016-04-11 DIAGNOSIS — Y939 Activity, unspecified: Secondary | ICD-10-CM | POA: Diagnosis not present

## 2016-04-11 DIAGNOSIS — M546 Pain in thoracic spine: Secondary | ICD-10-CM | POA: Insufficient documentation

## 2016-04-11 DIAGNOSIS — Z79899 Other long term (current) drug therapy: Secondary | ICD-10-CM | POA: Diagnosis not present

## 2016-04-11 MED ORDER — NAPROXEN 500 MG PO TABS: 500.0000 mg | ORAL_TABLET | Freq: Two times a day (BID) | ORAL | 0 refills | Status: AC

## 2016-04-11 MED ORDER — METHOCARBAMOL 500 MG PO TABS: 500.0000 mg | ORAL_TABLET | Freq: Every evening | ORAL | 0 refills | Status: AC | PRN
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
## 2016-04-11 NOTE — Discharge Instructions (Signed)
As discussed, use naproxen twice a day for pain and inflammation. Robaxin at nighttime as needed. Remember not to drive, operate heavy machinery or drink alcohol while taking this medication. Follow-up with your primary care provider at your visit on the fifth. I have provided you with instructions for stretchies and he may use heat for comfort.  Return to the emergency department if you experience any numbness, tingling, or any other new concerning symptoms.

## 2017-04-16 IMAGING — CT CT ABD-PELV W/ CM
2 of 5 series · 15 of 46 positions shown, 17 images · IV contrast (ISOVUE)
Comparison: None.

CLINICAL DATA: Right side abdominal pain starting last night

EXAM:
CT ABDOMEN AND PELVIS WITH CONTRAST
TECHNIQUE: Multidetector CT imaging of the abdomen and pelvis was performed
using the standard protocol following bolus administration of
intravenous contrast.
CONTRAST:  100mL FTEGQN-1KK IOPAMIDOL (FTEGQN-1KK) INJECTION 61%

[Series 2: abd/pel with · axial · 0.70mm/px · z∈[-420,-55]mm · 12 of 87 slices shown, 14 images]
[im 7/87  soft-tissue]
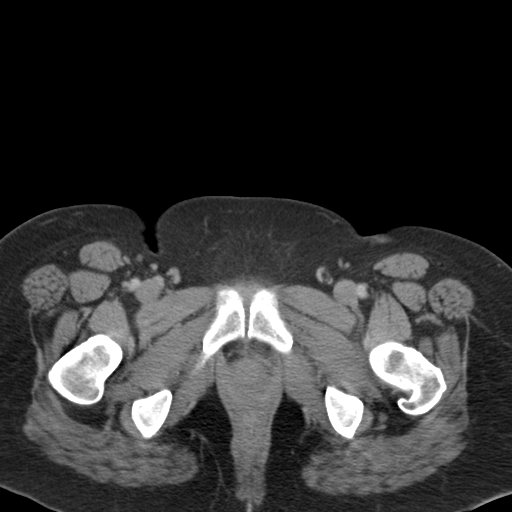
[im 7/87  bone]
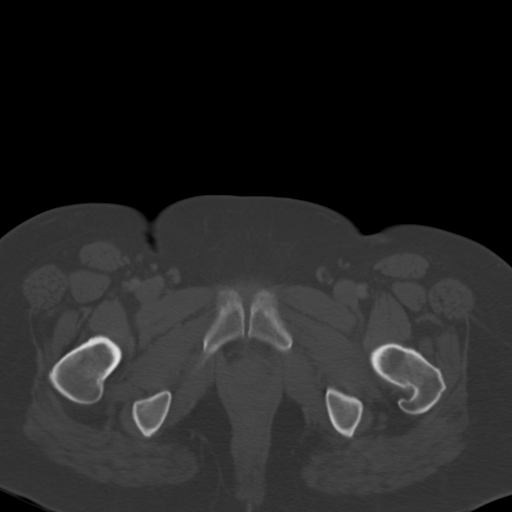
[im 13/87  soft-tissue]
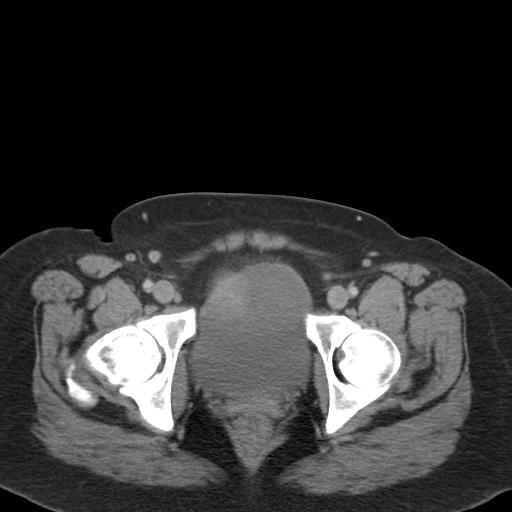
[im 19/87  soft-tissue]
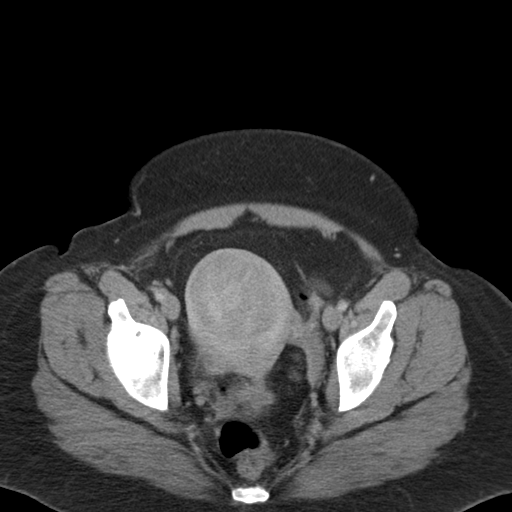
[im 25/87  soft-tissue]
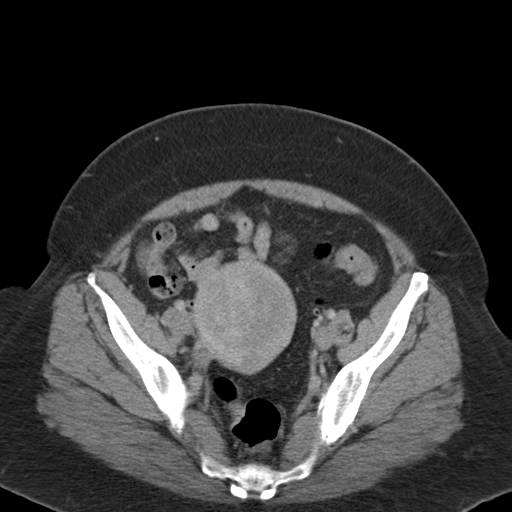
[im 31/87  soft-tissue]
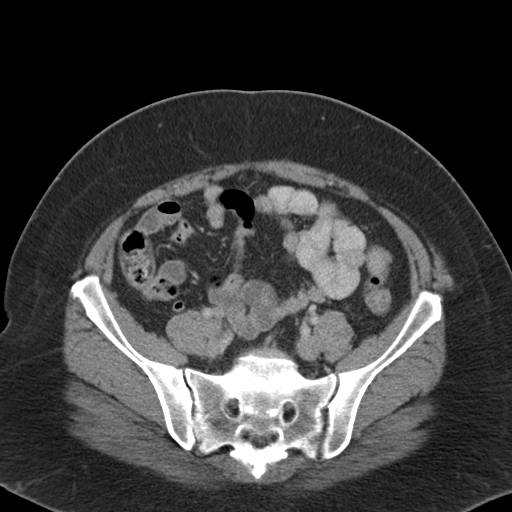
[im 37/87  soft-tissue]
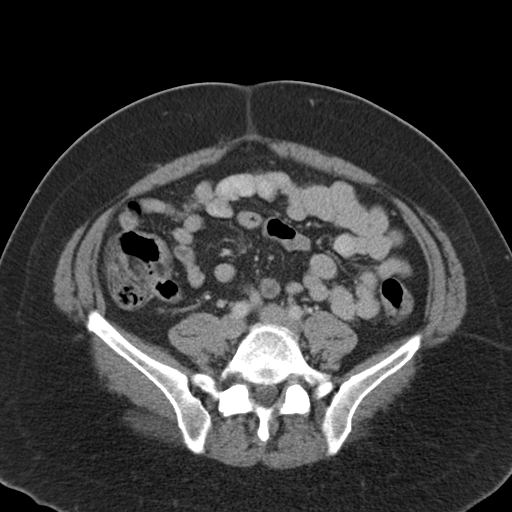
[im 50/87  soft-tissue]
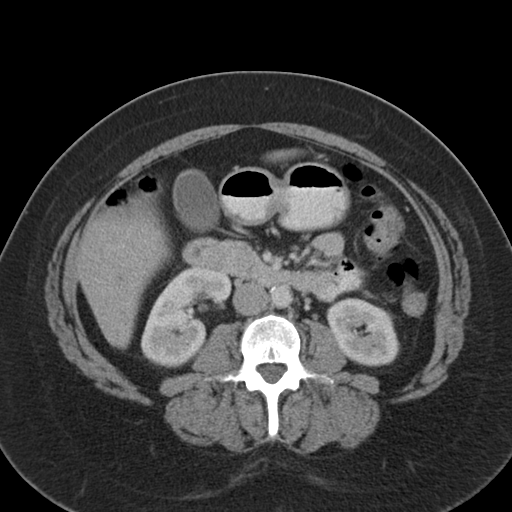
[im 56/87  soft-tissue]
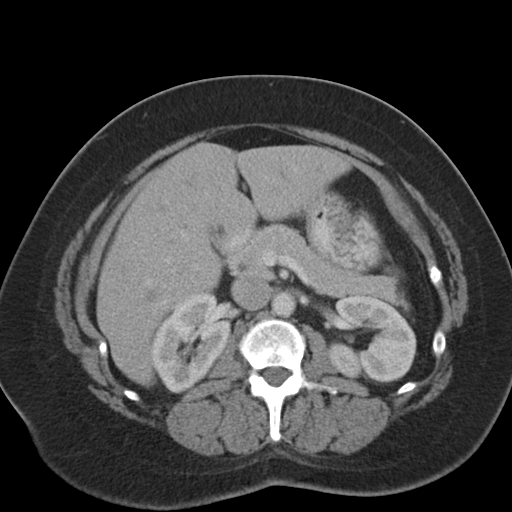
[im 62/87  soft-tissue]
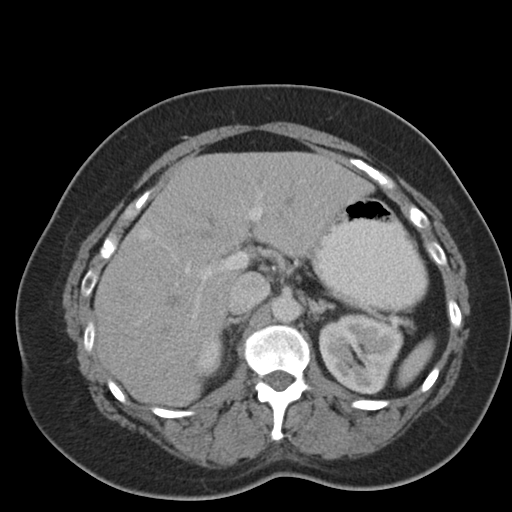
[im 62/87  bone]
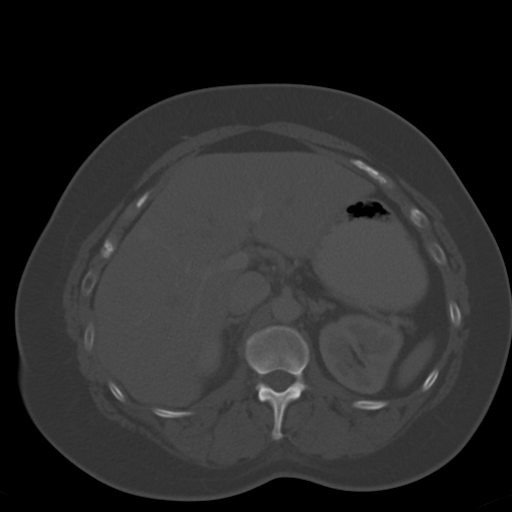
[im 68/87  soft-tissue]
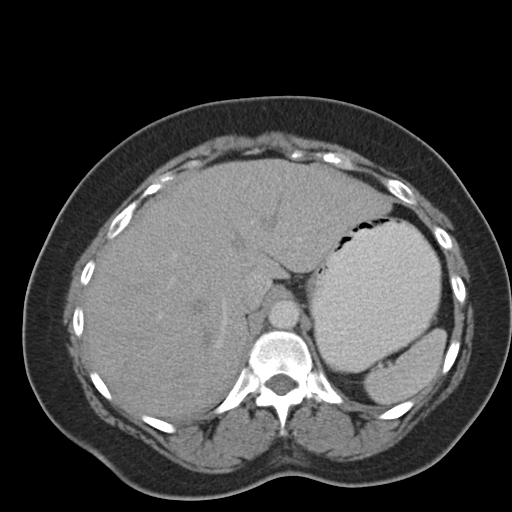
[im 74/87  soft-tissue]
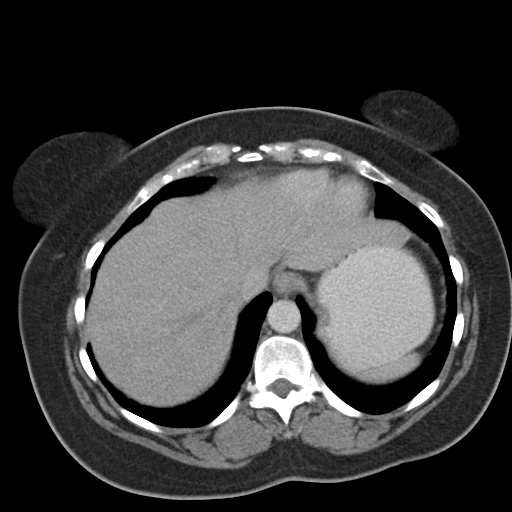
[im 80/87  soft-tissue]
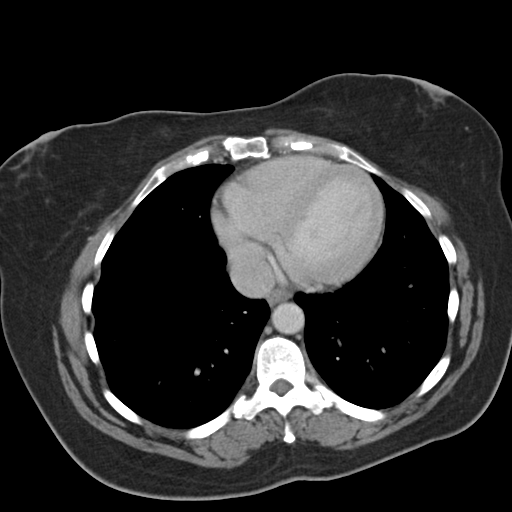

[Series 4: coronal a/|p · coronal · 0.74mm/px · 3 of 151 slices shown]
[im 51/151  soft-tissue]
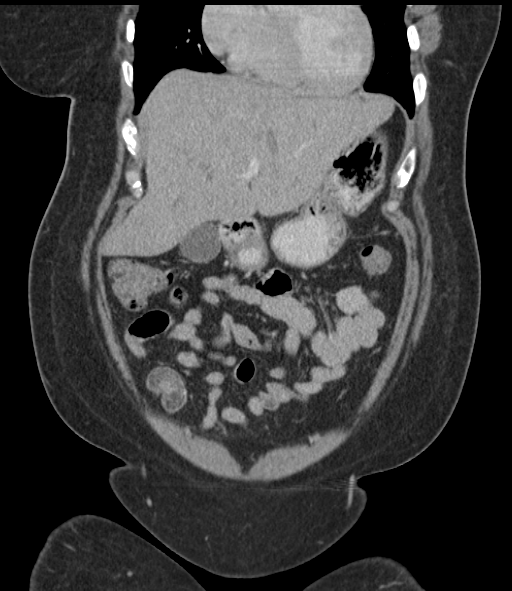
[im 67/151  soft-tissue]
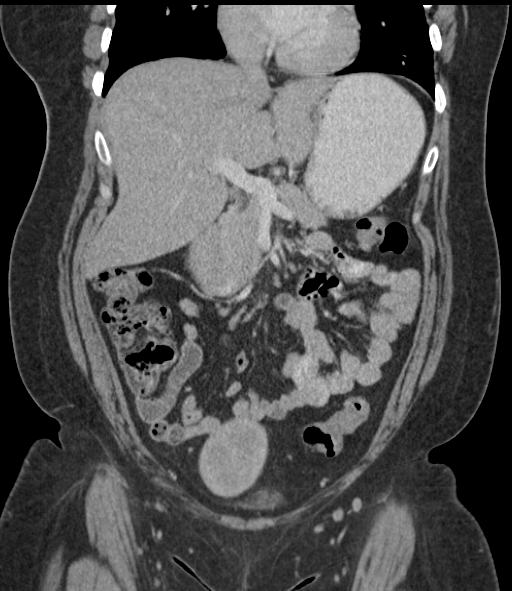
[im 84/151  soft-tissue]
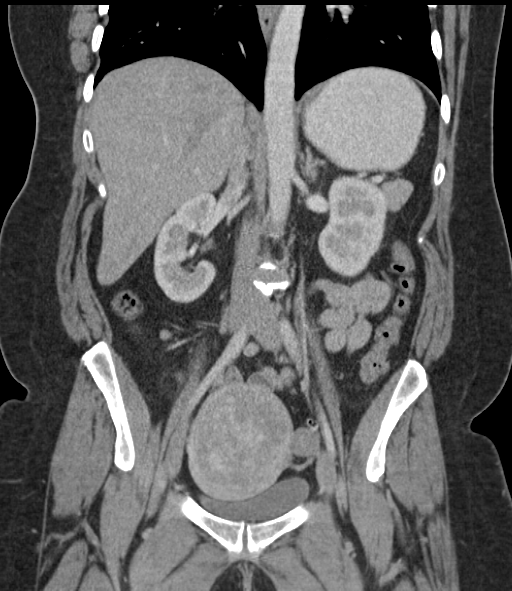

[15 of 46 positions shown; findings below may reference images not displayed]

FINDINGS: Lower chest:  Lung bases are unremarkable.

Hepatobiliary: Enhanced liver shows no focal mass. Hypervascular
focus in right hepatic lobe laterally measures 6 mm probable
atypical hemangioma. This is not identified on delayed images.

Pancreas: Enhanced pancreas is unremarkable.

Spleen: Enhanced spleen is unremarkable.

Adrenals/Urinary Tract: No adrenal gland mass. Enhanced kidneys are
symmetrical in appearance. No hydronephrosis or hydroureter. Delayed
renal images shows bilateral renal symmetrical excretion. Bilateral
visualized proximal ureter is unremarkable.

Stomach/Bowel: No gastric outlet obstruction. No small bowel
obstruction. Some colonic stool noted in right colon and proximal
transverse colon. Few diverticula are noted proximal transverse
colon. No evidence of colitis or acute diverticulitis. Normal
appendix is clearly visualized in axial image 61. No pericecal
inflammation. No distal colonic obstruction. No distal colitis.

Vascular/Lymphatic: Minimal atherosclerotic calcifications of distal
abdominal aorta. No aortic aneurysm. No retroperitoneal or
mesenteric adenopathy. No mesenteric fluid collection.

Reproductive: There is enlarged uterus with nodular appearance of
the fundus. Heterogeneous lesion midline uterine fundus measures
cm highly suspicious for myometrial fibroid. Submucosal fibroid
cannot be excluded. There is distortion of endometrial cavity.
Correlation with GYN exam and further correlation with ultrasound or
MRI is recommended as clinically warranted. No adnexal mass. The
ovaries are unremarkable.

Other: No ascites or free air.

Musculoskeletal: No destructive bony lesions are noted. Mild
degenerative changes lumbar spine. Mild disc space flattening with
mild anterior spurring and mild posterior disc bulge at L3-L4 level.
IMPRESSION: 1. There is no evidence of acute inflammatory process within
abdomen.
2. No hydronephrosis or hydroureter.
3. Normal appendix. No pericecal inflammation. No evidence of
colitis or diverticulitis.
4. There is enlarged uterus with nodular appearance of the fundus.
Heterogeneous lesion midline uterine fundus measures 6.6 cm highly
suspicious for myometrial fibroid. Submucosal fibroid cannot be
excluded. There is distortion of endometrial cavity. Correlation
with GYN exam and further evaluation with ultrasound or MRI is
recommended as clinically warranted.
5. Mild degenerative changes lumbar spine.
6. Question atypical hemangioma right hepatic lobe measures 6 mm.
Further correlation with ultrasound or MRI could be performed as
clinically warranted.

## 2017-06-05 ENCOUNTER — Encounter: Payer: Self-pay | Admitting: Gastroenterology

## 2017-07-25 LAB — GLUCOSE, POCT (MANUAL RESULT ENTRY): POC Glucose: 97 mg/dl (ref 70–99)

## 2018-03-09 ENCOUNTER — Encounter (HOSPITAL_BASED_OUTPATIENT_CLINIC_OR_DEPARTMENT_OTHER): Payer: Self-pay | Admitting: *Deleted

## 2018-03-09 ENCOUNTER — Emergency Department (HOSPITAL_BASED_OUTPATIENT_CLINIC_OR_DEPARTMENT_OTHER): Payer: PRIVATE HEALTH INSURANCE

## 2018-03-09 ENCOUNTER — Emergency Department (HOSPITAL_BASED_OUTPATIENT_CLINIC_OR_DEPARTMENT_OTHER)
Admission: EM | Admit: 2018-03-09 | Discharge: 2018-03-09 | Disposition: A | Discharge status: Home / Self Care | Payer: PRIVATE HEALTH INSURANCE | Attending: Emergency Medicine | Admitting: Emergency Medicine

## 2018-03-09 ENCOUNTER — Other Ambulatory Visit: Payer: Self-pay

## 2018-03-09 DIAGNOSIS — Z79899 Other long term (current) drug therapy: Secondary | ICD-10-CM | POA: Insufficient documentation

## 2018-03-09 DIAGNOSIS — R0789 Other chest pain: Secondary | ICD-10-CM

## 2018-03-09 DIAGNOSIS — R079 Chest pain, unspecified: Secondary | ICD-10-CM | POA: Diagnosis not present

## 2018-03-09 DIAGNOSIS — F17290 Nicotine dependence, other tobacco product, uncomplicated: Secondary | ICD-10-CM | POA: Insufficient documentation

## 2018-03-09 LAB — CBC
HCT: 36.2 % (ref 36.0–46.0)
Hemoglobin: 11.7 g/dL — ABNORMAL LOW (ref 12.0–15.0)
MCH: 30.5 pg (ref 26.0–34.0)
MCHC: 32.3 g/dL (ref 30.0–36.0)
MCV: 94.3 fL (ref 80.0–100.0)
Platelets: 357 10*3/uL (ref 150–400)
RBC: 3.84 MIL/uL — ABNORMAL LOW (ref 3.87–5.11)
RDW: 12.6 % (ref 11.5–15.5)
WBC: 9 10*3/uL (ref 4.0–10.5)
nRBC: 0 % (ref 0.0–0.2)

## 2018-03-09 LAB — TROPONIN I: Troponin I: <0.03 ng/mL (ref <0.03)

## 2018-03-09 LAB — BASIC METABOLIC PANEL
Anion gap: 7 (ref 5–15)
BUN: 17 mg/dL (ref 6–20)
CO2: 25 mmol/L (ref 22–32)
Calcium: 9.1 mg/dL (ref 8.9–10.3)
Chloride: 105 mmol/L (ref 98–111)
Creatinine, Ser: 0.67 mg/dL (ref 0.44–1.00)
GFR calc Af Amer: >60 mL/min (ref >60)
GFR calc non Af Amer: >60 mL/min (ref >60)
GLUCOSE: 96 mg/dL (ref 70–99)
Potassium: 3.8 mmol/L (ref 3.5–5.1)
Sodium: 137 mmol/L (ref 135–145)

## 2018-03-09 MED ORDER — MELOXICAM 7.5 MG PO TABS: 7.5000 mg | ORAL_TABLET | Freq: Every day | ORAL | 0 refills | Status: AC

## 2018-03-09 NOTE — ED Notes (Signed)
Pt verbalizes understanding of d/c instructions and denies any further needs at this time. 

## 2018-03-09 NOTE — ED Triage Notes (Signed)
Chest pain for a week. Worse with inhalation and exhalation. Denies cough. No hx of same pain.

## 2018-03-09 NOTE — Discharge Instructions (Signed)
Thank you for allowing me to care for you today. Please return to the emergency department if you have new or worsening symptoms. Take your medications as instructed.  ° °

## 2018-03-09 NOTE — ED Provider Notes (Signed)
[facility] EMERGENCY DEPARTMENT Provider Note   CSN: [id number] Arrival date & time: 03/09/18  2012    History   Chief Complaint Chief Complaint  Patient presents with  . Chest Pain    HPI Alyssa Butler is a 48 y.o. female.  HPI: A 48 year old patient presents for evaluation of chest pain. Initial onset of pain was more than 6 hours ago. The patient's chest pain is sharp and is not worse with exertion. The patient's chest pain is middle- or left-sided, is not well-localized, is not described as heaviness/pressure/tightness and does not radiate to the arms/jaw/neck. The patient does not complain of nausea and denies diaphoresis. The patient has smoked in the past 90 days. The patient has no history of stroke, has no history of peripheral artery disease, denies any history of treated diabetes, has no relevant family history of coronary artery disease (first degree relative at less than age 65), is not hypertensive, has no history of hypercholesterolemia and does not have an elevated BMI (>=30).   A 48 year old patient presents for evaluation of chest pain. Initial onset of pain was more than 6 hours ago. The patient's chest pain is sharp and is not worse with exertion. The patient's chest pain is middle- or left-sided, is not well-localized, is not described as heaviness/pressure/tightness and does not radiate to the arms/jaw/neck. The patient does not complain of nausea and denies diaphoresis. The patient has smoked in the past 90 days. The patient has no history of stroke, has no history of peripheral artery disease, denies any history of treated diabetes, has no relevant family history of coronary artery disease (first degree relative at less than age 64), is not hypertensive, has no history of hypercholesterolemia and does not have an elevated BMI (>=30).      Past Medical History:  Diagnosis Date  . Abdominal pain   . Constipation   . Fibroid   . GERD (gastroesophageal  reflux disease)   . Heartburn   . 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 Muscle pain   . Rectal bleed   . Skin rash   . Tubular adenoma of colon 07/29/2012  . Vision changes     Patient Active Problem List   Diagnosis Date Noted  . Fibroid uterus 11/11/2013  . Rectal bleeding 06/04/2012    Past Surgical History:  Procedure Laterality Date  . THERAPEUTIC ABORTION    . WISDOM TOOTH EXTRACTION       OB History    Gravida  1   Para      Term      Preterm      AB  1   Living        SAB      TAB  1   Ectopic      Multiple      Live Births               Home Medications    Prior to Admission medications   Medication Sig Start Date End Date Taking? Authorizing Provider  amLODipine (NORVASC) 5 MG tablet Take by mouth. 11/20/17  Yes [provider]  LINZESS 145 MCG CAPS capsule Take 145 mcg by mouth daily. 03/30/15  Yes [provider]  Multiple Vitamin (MULTIVITAMIN WITH MINERALS) TABS tablet Take 1 tablet by mouth every other day.   Yes [provider]  Omega-3 Fatty Acids (FISH OIL PO) Take by mouth.   Yes [provider]  omeprazole (PRILOSEC) 40  MG capsule Take by mouth. 07/12/16  Yes [provider]  Vitamin D, Ergocalciferol, (DRISDOL) 50000 UNITS CAPS capsule Take 1 capsule weekly for 8 weeks, then 1 every other week 12/30/13  Yes Elby Beck, FNP  diazepam (VALIUM) 10 MG tablet Take 1 tablet (10 mg total) by mouth every 6 (six) hours as needed (Muscle spasm). 06/27/15   Daleen Bo, MD  HYDROcodone-acetaminophen (NORCO) 5-325 MG tablet Take 1-2 tablets by mouth every 4 (four) hours as needed. 06/27/15   Daleen Bo, MD  meloxicam (MOBIC) 7.5 MG tablet Take 1 tablet (7.5 mg total) by mouth daily for 30 days. 03/09/18 04/08/18  Alveria Apley, PA-C  methocarbamol (ROBAXIN) 500 MG tablet Take 1 tablet (500 mg total) by mouth at bedtime as needed for muscle spasms. 04/11/16    Emeline General, PA-C  metroNIDAZOLE (FLAGYL) 500 MG tablet Take 1 tablet (500 mg total) by mouth 2 (two) times daily. Patient not taking: Reported on 06/27/2015 03/03/15   Huel Cote, NP  naproxen (NAPROSYN) 500 MG tablet Take 1 tablet (500 mg total) by mouth 2 (two) times daily with a meal. 04/11/16   Avie Echevaria B, PA-C  predniSONE (DELTASONE) 20 MG tablet Take 1 tablet (20 mg total) by mouth 2 (two) times daily. 06/27/15   Daleen Bo, MD    Family History Family History  Problem Relation Age of Onset  . Kidney failure Maternal Grandmother   . Heart attack Maternal Grandfather   . Heart attack Maternal Uncle     Social History Social History   Tobacco Use  . Smoking status: Current Every Day Smoker    Packs/day: 0.25    Years: 15.00    Pack years: 3.75    Types: Cigars  . Smokeless tobacco: Never Used  Substance Use Topics  . Alcohol use: Yes    Alcohol/week: 17.0 standard drinks    Types: 14 Cans of beer, 3 Standard drinks or equivalent per week  . Drug use: No     Allergies   Patient has no known allergies.   Review of Systems Review of Systems  Constitutional: Negative for chills and fever.  HENT: Negative for ear pain and sore throat.   Eyes: Negative for pain and visual disturbance.  Respiratory: Negative for cough and shortness of breath.   Cardiovascular: Positive for chest pain. Negative for palpitations.  Gastrointestinal: Negative for abdominal pain and vomiting.  Genitourinary: Negative for dysuria and hematuria.  Musculoskeletal: Negative for arthralgias and back pain.  Skin: Negative for color change and rash.  Neurological: Negative for seizures and syncope.  All other systems reviewed and are negative.    Physical Exam Updated Vital Signs BP (!) 146/69   Pulse 67   Temp 98.3 F (36.8 C) (Oral)   Resp 18   Ht 5\' 6"  (1.676 m)   Wt 86.6 kg   LMP 03/08/2013   SpO2 100%   BMI 30.83 kg/m   Physical Exam Vitals signs and  nursing note reviewed.  HENT:     Head: Normocephalic and atraumatic.     Nose: Nose normal.     Mouth/Throat:     Mouth: Mucous membranes are moist.  Eyes:     Conjunctiva/sclera: Conjunctivae normal.     Pupils: Pupils are equal, round, and reactive to light.  Cardiovascular:     Rate and Rhythm: Normal rate and regular rhythm.  Pulmonary:     Effort: Pulmonary effort is normal.     Breath sounds:  Normal breath sounds. No decreased breath sounds, wheezing, rhonchi or rales.  Chest:     Chest wall: Tenderness (left chest with palpation) present.  Musculoskeletal:     Right lower leg: No edema.     Left lower leg: No edema.  Skin:    General: Skin is warm.     Capillary Refill: Capillary refill takes less than 2 seconds.  Neurological:     General: No focal deficit present.     Mental Status: She is alert.  Psychiatric:        Mood and Affect: Mood normal.      ED Treatments / Results  Labs (all labs ordered are listed, but only abnormal results are displayed) Labs Reviewed  CBC - Abnormal; Notable for the following components:      Result Value   RBC 3.84 (*)    Hemoglobin 11.7 (*)    All other components within normal limits  BASIC METABOLIC PANEL  TROPONIN I    EKG EKG Interpretation  Date/Time:  Monday March 09 2018 20:21:36 EST Ventricular Rate:  63 PR Interval:  154 QRS Duration: 90 QT Interval:  368 QTC Calculation: 376 R Axis:   38 Text Interpretation:  Normal sinus rhythm Normal ECG No STEMI.  Confirmed by Nanda Quinton [id number] on 03/09/2018 8:56:21 PM   Radiology Dg Chest 2 View  Result Date: 03/09/2018 CLINICAL DATA:  Chest pain EXAM: CHEST - 2 VIEW COMPARISON:  07/04/2013 FINDINGS: Heart and mediastinal contours are within normal limits. No focal opacities or effusions. No acute bony abnormality. IMPRESSION: No active cardiopulmonary disease. Electronically Signed   By: Rolm Baptise M.D.   On: 03/09/2018 21:43    Procedures Procedures  (including critical care time)  Medications Ordered in ED Medications - No data to display   Initial Impression / Assessment and Plan / ED Course  I have reviewed the triage vital signs and the nursing notes.  Pertinent labs & imaging results that were available during my care of the patient were reviewed by me and considered in my medical decision making (see chart for details).  Clinical Course as of Mar 09 2221  Mon Feb 24, 20238  951 A 48 year old patient presents for evaluation of chest pain. Initial onset of pain was more than 6 hours ago. The patient's chest pain is sharp and is not worse with exertion. The patient's chest pain is middle- or left-sided, is not well-localized, is not described as heaviness/pressure/tightness and does not radiate to the arms/jaw/neck. The patient does not complain of nausea and denies diaphoresis. The patient has smoked in the past 90 days. The patient has no history of stroke, has no history of peripheral artery disease, denies any history of treated diabetes, has no relevant family history of coronary artery disease (first degree relative at less than age 63), is not hypertensive, has no history of hypercholesterolemia and does not have an elevated BMI (>=30).   Her EKG was normal, chest x-ray negative, she is PERC negative.  Lab work was unremarkable including negative troponin.  Unclear etiology of chest pain but highly doubt cardiopulmonary etiology.  Advised to follow-up with her primary care doctor.   [KM]    Clinical Course User Index [KM] Kristine Royal    HEAR Score: 2  Based on review of vitals, medical screening exam, lab work and/or imaging, there does not appear to be an acute, emergent etiology for the patient's symptoms. Counseled pt on good return precautions and encouraged both  PCP and ED follow-up as needed.  Prior to discharge, I also discussed incidental imaging findings with patient in detail and advised appropriate,  recommended follow-up in detail.  Clinical Impression: 1. Atypical chest pain     Disposition: Discharge   This note was prepared with assistance of Dragon voice recognition software. Occasional wrong-word or sound-a-like substitutions may have occurred due to the inherent limitations of voice recognition software.   Final Clinical Impressions(s) / ED Diagnoses   Final diagnoses:  Atypical chest pain    ED Discharge Orders         Ordered    meloxicam (MOBIC) 7.5 MG tablet  Daily     03/09/18 2222           Kristine Royal 03/09/18 2223    Margette Fast, MD 03/10/18 1010

## 2019-01-27 ENCOUNTER — Ambulatory Visit: Payer: Managed Care, Other (non HMO) | Attending: Internal Medicine

## 2019-01-27 DIAGNOSIS — Z20822 Contact with and (suspected) exposure to covid-19: Secondary | ICD-10-CM

## 2019-01-28 LAB — NOVEL CORONAVIRUS, NAA: SARS-CoV-2, NAA: NOT DETECTED

## 2019-12-28 IMAGING — DX DG CHEST 2V
2 series · 2 of 2 positions shown · non-contrast
Comparison: 07/04/2013

CLINICAL DATA: Chest pain

EXAM:
CHEST - 2 VIEW

[chest pa]
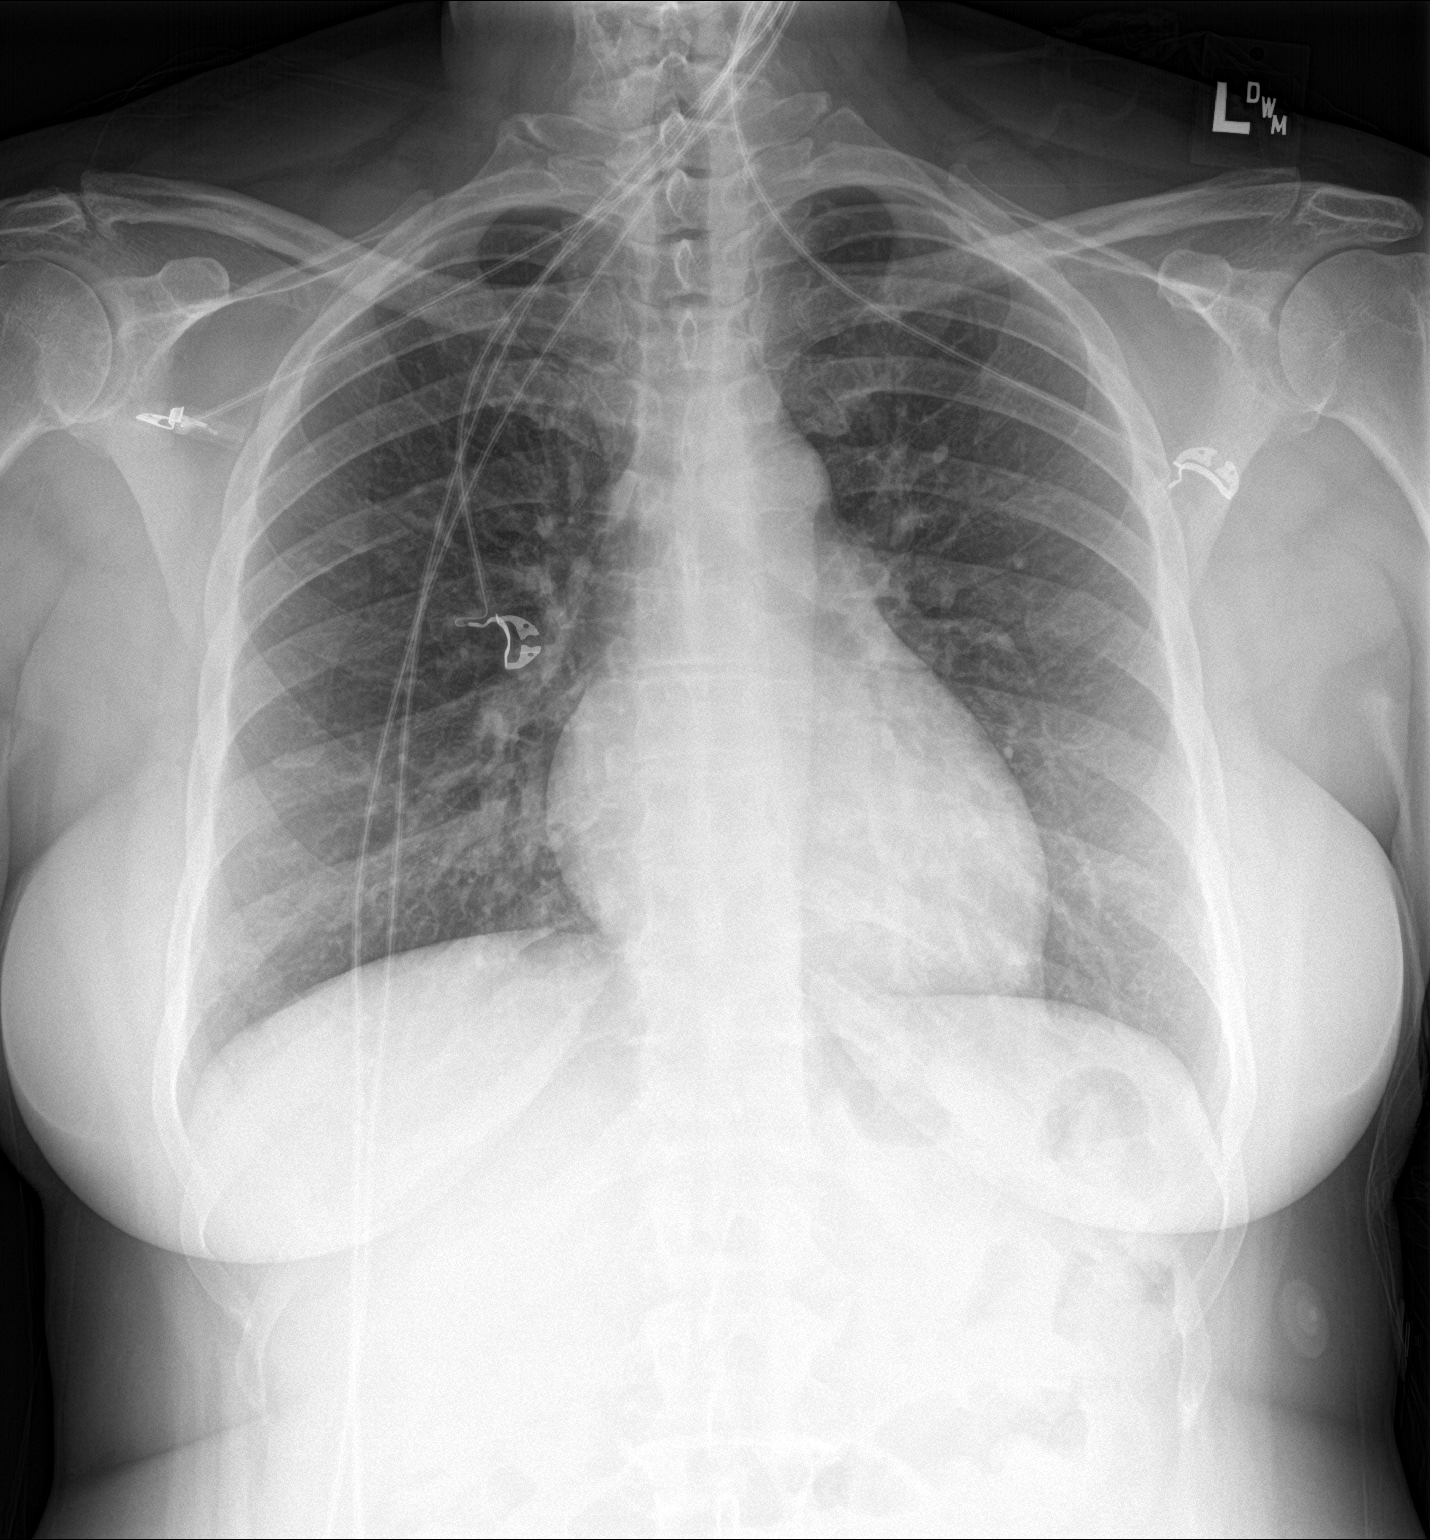

[chest lat]
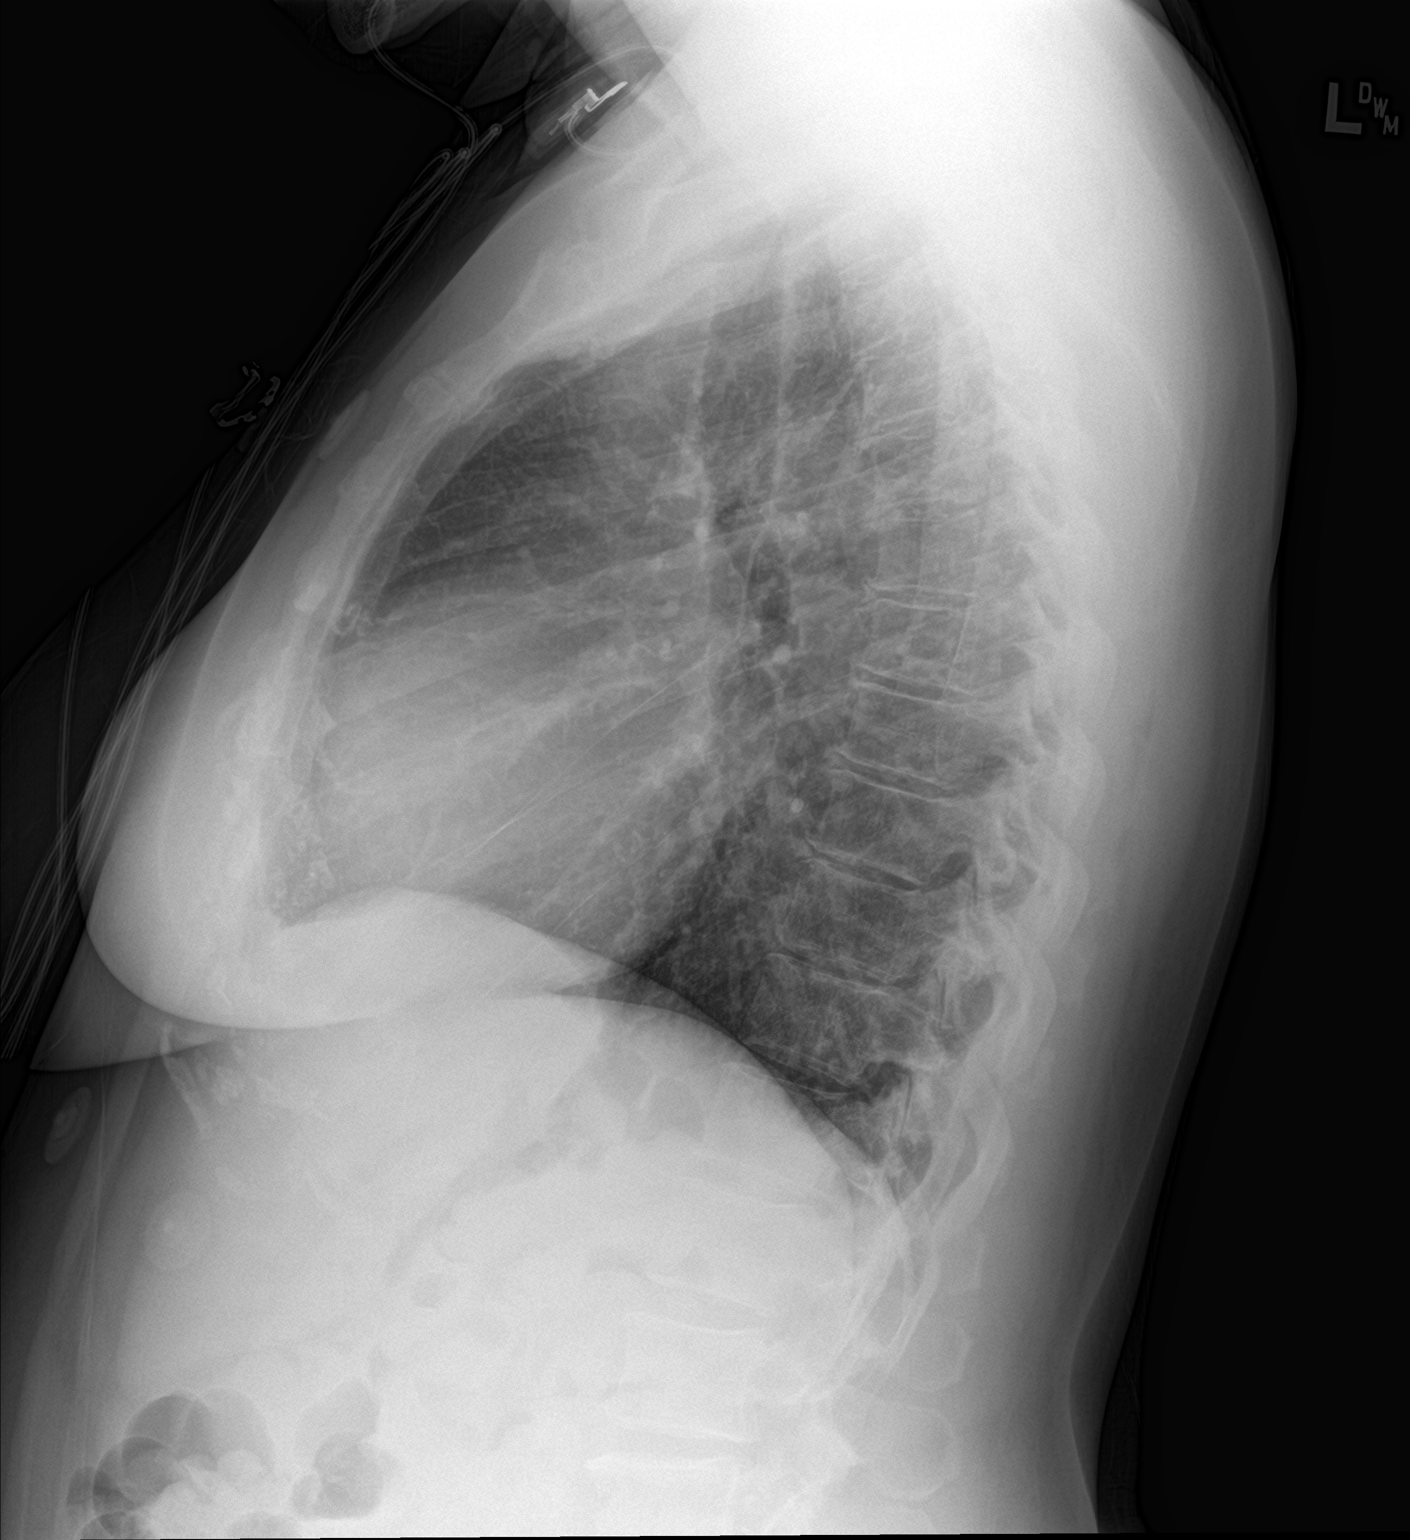

[2 of 2 positions shown; findings below may reference images not displayed]

FINDINGS: Heart and mediastinal contours are within normal limits. No focal
opacities or effusions. No acute bony abnormality.
IMPRESSION: No active cardiopulmonary disease.
